# Patient Record
Sex: Male | Born: 1955 | Hispanic: No | Marital: Single | Smoking: Never smoker
Health system: Southern US, Community
[De-identification: ages and names within clinical notes are randomized; demographics above are authoritative.]

## PROBLEM LIST (undated history)

## (undated) DIAGNOSIS — J45909 Unspecified asthma, uncomplicated: Secondary | ICD-10-CM

## (undated) DIAGNOSIS — F32A Depression, unspecified: Secondary | ICD-10-CM

## (undated) DIAGNOSIS — F329 Major depressive disorder, single episode, unspecified: Secondary | ICD-10-CM

## (undated) DIAGNOSIS — G4730 Sleep apnea, unspecified: Secondary | ICD-10-CM

## (undated) DIAGNOSIS — F419 Anxiety disorder, unspecified: Secondary | ICD-10-CM

## (undated) DIAGNOSIS — I10 Essential (primary) hypertension: Secondary | ICD-10-CM

## (undated) HISTORY — DX: Major depressive disorder, single episode, unspecified: F32.9

## (undated) HISTORY — PX: REFRACTIVE SURGERY: SHX103

## (undated) HISTORY — DX: Sleep apnea, unspecified: G47.30

## (undated) HISTORY — DX: Depression, unspecified: F32.A

## (undated) HISTORY — DX: Unspecified asthma, uncomplicated: J45.909

## (undated) HISTORY — DX: Essential (primary) hypertension: I10

## (undated) HISTORY — DX: Anxiety disorder, unspecified: F41.9

---

## 1998-07-24 ENCOUNTER — Emergency Department (HOSPITAL_COMMUNITY): Admission: EM | Admit: 1998-07-24 | Discharge: 1998-07-24 | Payer: Self-pay | Admitting: Emergency Medicine

## 2012-08-08 ENCOUNTER — Ambulatory Visit (INDEPENDENT_AMBULATORY_CARE_PROVIDER_SITE_OTHER): Payer: Self-pay | Admitting: Family Medicine

## 2012-08-08 VITALS — BP 138/82 | HR 92 | Temp 97.9°F | Resp 18 | Ht 64.5 in | Wt 209.6 lb

## 2012-08-08 DIAGNOSIS — Z0289 Encounter for other administrative examinations: Secondary | ICD-10-CM

## 2012-08-08 DIAGNOSIS — H547 Unspecified visual loss: Secondary | ICD-10-CM

## 2012-08-08 DIAGNOSIS — Z024 Encounter for examination for driving license: Secondary | ICD-10-CM

## 2012-08-08 DIAGNOSIS — L303 Infective dermatitis: Secondary | ICD-10-CM

## 2012-08-08 NOTE — Progress Notes (Signed)
57 year old man who is here for his DVT examination. He was supposed to set something in the [place] by February, but now he had an extension deadline until this coming Friday. He comes in today for his DVT. He has been healthy denies medical history.  Objective: Visual acuity is poor as noted. His TMs are normal. Eyes PERRLA. Fundi have some additional pigment, probably congenital, uncertain etiology. His throat was clear. Neck supple without nodes thyromegaly. Chest is clear to auscultation. Heart regular without murmurs gallops or arrhythmias. Abdomen soft without mass or tenderness. Normal external genitalia testes descended. Straight leg raise test negative. Spine normal. The piriformis symmetrical. Skin has eczematoid excoriations on his ankles. He is alert and oriented. Motor is symmetrical.  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  Plan: Cardiovascular 10 eye doctor and get his approval for his vision before I can clear him. He needs gasses from my opinion. If he brings his form back from the ophthalmologist signed I will complete his card. I actually do not need to see him again obviously this was very anxiety provoking and stressful to him since his deadline is 3 days from now, but I told him I could not help that. He understood.Marland Kitchen

## 2012-08-08 NOTE — Patient Instructions (Signed)
See an eye specialist and bring the signed form back.  Suggest seeing a dermatologist for the skin on the legs. For

## 2012-08-10 ENCOUNTER — Ambulatory Visit: Payer: Self-pay | Admitting: Family Medicine

## 2012-08-10 ENCOUNTER — Encounter: Payer: Self-pay | Admitting: Family Medicine

## 2012-08-10 ENCOUNTER — Telehealth: Payer: Self-pay | Admitting: Radiology

## 2012-08-10 VITALS — BP 189/113 | HR 84 | Temp 98.6°F | Resp 16 | Ht 64.5 in | Wt 206.0 lb

## 2012-08-10 DIAGNOSIS — Z0000 Encounter for general adult medical examination without abnormal findings: Secondary | ICD-10-CM

## 2012-08-10 DIAGNOSIS — Z0289 Encounter for other administrative examinations: Secondary | ICD-10-CM

## 2012-08-10 LAB — GLUCOSE, POCT (MANUAL RESULT ENTRY): POC Glucose: 127 mg/dl — AB (ref 70–99)

## 2012-08-10 NOTE — Telephone Encounter (Signed)
Patient came back in, unfortunately he still does not meet requirements for visual acuity even with correction. The paperwork from the optician indicates he is 20/50 with correction in his right eye. I did provide him information from the [facility] website regarding trying to get an exception for this. To you FYI

## 2012-08-10 NOTE — Progress Notes (Signed)
Subjective: Patient is here for followup with her can't his vision. He failed to the other day. He has since gotten classes ordered. His vision corrects to 20/25 bilaterally. The optometrist suspected early diabetic changes in the eye.  Objective: Very anxious, otherwise okay. Glucose satisfactory pronounced fasting specimen  Assessment: Decreased visual acuity  Truck driver CDL exam Possible early diabetic retinopathy, no diabetic diagnosis.  Plan: Advised return in about 6 months to recheck his sugars. However this is not a not part of the DOT yet.

## 2012-08-15 NOTE — Telephone Encounter (Signed)
Note regarding vision noted.

## 2014-08-11 ENCOUNTER — Ambulatory Visit: Payer: Self-pay

## 2014-12-27 ENCOUNTER — Ambulatory Visit (INDEPENDENT_AMBULATORY_CARE_PROVIDER_SITE_OTHER): Payer: Self-pay | Admitting: Emergency Medicine

## 2014-12-27 VITALS — BP 156/110 | HR 77 | Temp 98.0°F | Resp 16 | Ht 64.0 in | Wt 193.0 lb

## 2014-12-27 DIAGNOSIS — R739 Hyperglycemia, unspecified: Secondary | ICD-10-CM

## 2014-12-27 DIAGNOSIS — I10 Essential (primary) hypertension: Secondary | ICD-10-CM

## 2014-12-27 DIAGNOSIS — Z0000 Encounter for general adult medical examination without abnormal findings: Secondary | ICD-10-CM

## 2014-12-27 LAB — GLUCOSE, POCT (MANUAL RESULT ENTRY): POC Glucose: 110 mg/dl — AB (ref 70–99)

## 2014-12-27 MED ORDER — HYDROCHLOROTHIAZIDE 12.5 MG PO CAPS: 12.5000 mg | ORAL_CAPSULE | Freq: Every day | ORAL | Status: DC

## 2014-12-27 NOTE — Progress Notes (Addendum)
Patient ID: Matthew Ponce, male   DOB: 1955/10/08, 59 y.o.   MRN: [id number]    This chart was scribed for Earl Lites, MD by [facility], medical scribe at [facility].The patient was seen in exam room 02 and the patient's care was started at 2:55 PM.  Chief Complaint:  Chief Complaint  Patient presents with  . DOT   HPI: Matthew Ponce is a 59 y.o. male who reports to [facility] today for a DOT physical. Blood pressure is elevated today, 156/110 Blood pressure recheck is 176/110 on the right, and 174/110. He does not take any medicines. No family history of hypertension. Denies excessive salt intake. States he has white coat syndrome.  BP Readings from Last 3 Encounters:  12/27/14 156/110  08/10/12 189/113  08/08/12 138/82   No past medical history on file. No past surgical history on file. Social History   Social History  . Marital Status: Single    Spouse Name: N/A  . Number of Children: N/A  . Years of Education: N/A   Social History Main Topics  . Smoking status: Never Smoker   . Smokeless tobacco: Not on file  . Alcohol Use: No  . Drug Use: No  . Sexual Activity: Not on file   Other Topics Concern  . Not on file   Social History Narrative  . No narrative on file   No family history on file. No Known Allergies Prior to Admission medications   Not on File   ROS: The patient denies fevers, chills, night sweats, unintentional weight loss, chest pain, palpitations, wheezing, dyspnea on exertion, nausea, vomiting, abdominal pain, dysuria, hematuria, melena, numbness, weakness, or tingling.  All other systems have been reviewed and were otherwise negative with the exception of those mentioned in the HPI and as above.    PHYSICAL EXAM: Filed Vitals:   12/27/14 1506  BP: 156/110  Pulse: 77  Temp: 98 F (36.7 C)  Resp: 16   Body mass index is 33.11 kg/(m^2).  General: Alert, no acute distress HEENT:  Normocephalic, atraumatic, oropharynx  patent. Eye: Nonie [facility]oyer [facility] Cardiovascular:  Regular rate and rhythm, no rubs murmurs or gallops.  No Carotid bruits, radial pulse intact. No pedal edema.  Respiratory: Clear to auscultation bilaterally.  No wheezes, rales, or rhonchi.  No cyanosis, no use of accessory musculature Abdominal: No organomegaly, abdomen is soft and non-tender, positive bowel sounds.  No masses. Musculoskeletal: Gait intact. No edema, tenderness Skin: No rashes. Neurologic: Facial musculature symmetric. Psychiatric: Patient acts appropriately throughout our interaction. Lymphatic: No cervical or submandibular lymphadenopathy  LABS: Results for orders placed or performed in visit on 08/10/12  POCT glucose (manual entry)  Result Value Ref Range   POC Glucose 127 (A) 70 - 99 mg/dl   Results for orders placed or performed in visit on 12/27/14  POCT glucose (manual entry)  Result Value Ref Range   POC Glucose 110 (A) 70 - 99 mg/dl    EKG/XRAY:   Primary read interpreted by Dr. Cleta Alberts at [facility].  ASSESSMENT/PLAN:  patient does not qualify for a DOT card. I told him he would need to be seen by a regular physician. And evaluated regarding his blood pressure. I advised him the blood pressure must be less than 140/90 to qualify for DOT card. He was agreeable to start on HCTZ 12.5. I gave him information regarding hypertension I advised him to establish himself with a PCP to follow his blood pressure. In the interim he  will have it checked in either [facility] or [facility] and record his blood pressure readings. Gross sideeffects, risk and benefits, and alternatives of medications d/w patient. Patient is aware that all medications have potential sideeffects and we are unable to predict every sideeffect or drug-drug interaction that may occur.    Lesle Chris MD 12/27/2014 3:09 PM

## 2014-12-27 NOTE — Patient Instructions (Signed)

## 2015-04-14 ENCOUNTER — Encounter (HOSPITAL_COMMUNITY): Payer: Self-pay | Admitting: Emergency Medicine

## 2015-04-14 ENCOUNTER — Emergency Department (HOSPITAL_COMMUNITY)
Admission: EM | Admit: 2015-04-14 | Discharge: 2015-04-14 | Disposition: A | Discharge status: Home / Self Care | Payer: Self-pay | Attending: Emergency Medicine | Admitting: Emergency Medicine

## 2015-04-14 DIAGNOSIS — Z79899 Other long term (current) drug therapy: Secondary | ICD-10-CM | POA: Insufficient documentation

## 2015-04-14 DIAGNOSIS — R531 Weakness: Secondary | ICD-10-CM | POA: Insufficient documentation

## 2015-04-14 LAB — CBC WITH DIFFERENTIAL/PLATELET
BASOS PCT: 0 %
Basophils Absolute: 0 10*3/uL (ref 0.0–0.1)
EOS ABS: 0 10*3/uL (ref 0.0–0.7)
EOS PCT: 1 %
HCT: 32.9 % — ABNORMAL LOW (ref 39.0–52.0)
HEMOGLOBIN: 10.6 g/dL — AB (ref 13.0–17.0)
LYMPHS ABS: 1.3 10*3/uL (ref 0.7–4.0)
Lymphocytes Relative: 33 %
MCH: 30 pg (ref 26.0–34.0)
MCHC: 32.2 g/dL (ref 30.0–36.0)
MCV: 93.2 fL (ref 78.0–100.0)
MONOS PCT: 11 %
Monocytes Absolute: 0.4 10*3/uL (ref 0.1–1.0)
NEUTROS PCT: 55 %
Neutro Abs: 2.2 10*3/uL (ref 1.7–7.7)
PLATELETS: 204 10*3/uL (ref 150–400)
RBC: 3.53 MIL/uL — ABNORMAL LOW (ref 4.22–5.81)
RDW: 15.8 % — ABNORMAL HIGH (ref 11.5–15.5)
WBC: 3.9 10*3/uL — AB (ref 4.0–10.5)

## 2015-04-14 LAB — URINALYSIS, ROUTINE W REFLEX MICROSCOPIC
BILIRUBIN URINE: NEGATIVE
GLUCOSE, UA: NEGATIVE mg/dL
HGB URINE DIPSTICK: NEGATIVE
KETONES UR: NEGATIVE mg/dL
Leukocytes, UA: NEGATIVE
Nitrite: NEGATIVE
PH: 6 (ref 5.0–8.0)
Protein, ur: NEGATIVE mg/dL
SPECIFIC GRAVITY, URINE: 1.028 (ref 1.005–1.030)

## 2015-04-14 LAB — COMPREHENSIVE METABOLIC PANEL
ALBUMIN: 3.8 g/dL (ref 3.5–5.0)
ALT: 16 U/L — ABNORMAL LOW (ref 17–63)
ANION GAP: 4 — AB (ref 5–15)
AST: 20 U/L (ref 15–41)
Alkaline Phosphatase: 35 U/L — ABNORMAL LOW (ref 38–126)
BUN: 29 mg/dL — AB (ref 6–20)
CALCIUM: 9.1 mg/dL (ref 8.9–10.3)
CHLORIDE: 110 mmol/L (ref 101–111)
CO2: 30 mmol/L (ref 22–32)
CREATININE: 0.56 mg/dL — AB (ref 0.61–1.24)
GFR calc non Af Amer: >60 mL/min (ref >60)
GLUCOSE: 96 mg/dL (ref 65–99)
POTASSIUM: 4 mmol/L (ref 3.5–5.1)
SODIUM: 144 mmol/L (ref 135–145)
Total Bilirubin: 0.8 mg/dL (ref 0.3–1.2)
Total Protein: 6.6 g/dL (ref 6.5–8.1)

## 2015-04-14 LAB — CBG MONITORING, ED: Glucose-Capillary: 83 mg/dL (ref 65–99)

## 2015-04-14 LAB — TROPONIN I

## 2015-04-14 NOTE — ED Provider Notes (Signed)
CSN: [id number]     Arrival date & time 04/14/15  1354 History   First MD Initiated Contact with Patient 04/14/15 1943     Chief Complaint  Patient presents with  . Blurred Vision  . wants to be checked for diabetes      (Consider location/radiation/quality/duration/timing/severity/associated sxs/prior Treatment) HPI Comments: Patient here with several complaints including concern for diabetes as well as high blood pressure. He has no primary care doctor. 2 months ago was told his blood pressure was elevated and has not followed up with that. Taste that he has had some blurred vision but denies any polyuria polydipsia. Has endorsed some weight loss over the past 5-6 months. Denies any change in his bowel habits. No chest or abdominal pain. Says he just does not feel well that this is been several month duration of the symptoms. Has not seen a provider recently for these complaints due to insurance issues  The history is provided by the patient.    History reviewed. No pertinent past medical history. History reviewed. No pertinent past surgical history. No family history on file. Social History  Substance Use Topics  . Smoking status: Never Smoker   . Smokeless tobacco: None  . Alcohol Use: No    Review of Systems  All other systems reviewed and are negative.     Allergies  Review of patient's allergies indicates no known allergies.  Home Medications   Prior to Admission medications   Medication Sig Start Date End Date Taking? Authorizing Provider  hydrochlorothiazide (MICROZIDE) 12.5 MG capsule Take 1 capsule (12.5 mg total) by mouth daily. 12/27/14   Collene GobbleSteven A Daub, MD   BP 142/95 mmHg  Pulse 62  Temp(Src) 98.1 F (36.7 C) (Oral)  Resp 16  Ht 5\' 7"  (1.702 m)  Wt 60.555 kg  BMI 20.90 kg/m2  SpO2 100% Physical Exam  Constitutional: He is oriented to person, place, and time. He appears well-developed and well-nourished.  Non-toxic appearance. No distress.  HENT:   Head: Normocephalic and atraumatic.  Eyes: Conjunctivae, EOM and lids are normal. Pupils are equal, round, and reactive to light.  Neck: Normal range of motion. Neck supple. No tracheal deviation present. No thyroid mass present.  Cardiovascular: Normal rate, regular rhythm and normal heart sounds.  Exam reveals no gallop.   No murmur heard. Pulmonary/Chest: Effort normal and breath sounds normal. No stridor. No respiratory distress. He has no decreased breath sounds. He has no wheezes. He has no rhonchi. He has no rales.  Abdominal: Soft. Normal appearance and bowel sounds are normal. He exhibits no distension. There is no tenderness. There is no rebound and no CVA tenderness.  Musculoskeletal: Normal range of motion. He exhibits no edema or tenderness.  Neurological: He is alert and oriented to person, place, and time. He has normal strength. No cranial nerve deficit or sensory deficit. GCS eye subscore is 4. GCS verbal subscore is 5. GCS motor subscore is 6.  Skin: Skin is warm and dry. No abrasion and no rash noted.  Psychiatric: He has a normal mood and affect. His speech is normal and behavior is normal.  Nursing note and vitals reviewed.   ED Course  Procedures (including critical care time) Labs Review Labs Reviewed  CBC WITH DIFFERENTIAL/PLATELET - Abnormal; Notable for the following:    WBC 3.9 (*)    RBC 3.53 (*)    Hemoglobin 10.6 (*)    HCT 32.9 (*)    RDW 15.8 (*)    All  other components within normal limits  COMPREHENSIVE METABOLIC PANEL - Abnormal; Notable for the following:    BUN 29 (*)    Creatinine, Ser 0.56 (*)    ALT 16 (*)    Alkaline Phosphatase 35 (*)    Anion gap 4 (*)    All other components within normal limits  URINALYSIS, ROUTINE W REFLEX MICROSCOPIC (NOT AT [facility]) - Abnormal; Notable for the following:    APPearance CLOUDY (*)    All other components within normal limits  TROPONIN I  CBG MONITORING, ED    Imaging Review No results found. I have  personally reviewed and evaluated these images and lab results as part of my medical decision-making.   EKG Interpretation   Date/Time:  Monday April 14 2015 20:32:33 EST Ventricular Rate:  58 PR Interval:  169 QRS Duration: 83 QT Interval:  440 QTC Calculation: 432 R Axis:   49 Text Interpretation:  Sinus rhythm Atrial premature complex Borderline T  abnormalities, inferior leads Borderline ST elevation, inferior leads  Confirmed by Freida Busman  MD, Geraldina Parrott ([id number]) on 04/14/2015 8:45:19 PM      MDM   Final diagnoses:  None    Patient without acute findings on his workup here today. Will give referral to community wellness    Lorre Nick, MD 04/14/15 2215

## 2015-04-14 NOTE — ED Notes (Signed)
Pt states that around 2 months ago he was at [facility] and was told he has high blood pressure but wants started on any medications.  [facility]  Pt states that he has had blurred vision, slurred speech about 2 years ago and wants to be checked for diabetes.  Pt states that he doesn't know how to eat if he has diabetes.  Pt states that he has lost about 40#s over 5-6 months.

## 2015-04-14 NOTE — Discharge Instructions (Signed)
Weakness °Weakness is a lack of strength. It may be felt all over the body (generalized) or in one specific part of the body (focal). Some causes of weakness can be serious. You may need further medical evaluation, especially if you are elderly or you have a history of immunosuppression (such as chemotherapy or HIV), kidney disease, heart disease, or diabetes. °CAUSES  °Weakness can be caused by many different things, including: °· Infection. °· Physical exhaustion. °· Internal bleeding or other blood loss that results in a lack of red blood cells (anemia). °· Dehydration. This cause is more common in elderly people. °· Side effects or electrolyte abnormalities from medicines, such as pain medicines or sedatives. °· Emotional distress, anxiety, or depression. °· Circulation problems, especially severe peripheral arterial disease. °· Heart disease, such as rapid atrial fibrillation, bradycardia, or heart failure. °· Nervous system disorders, such as Guillain-Barré syndrome, multiple sclerosis, or stroke. °DIAGNOSIS  °To find the cause of your weakness, your caregiver will take your history and perform a physical exam. Lab tests or X-rays may also be ordered, if needed. °TREATMENT  °Treatment of weakness depends on the cause of your symptoms and can vary greatly. °HOME CARE INSTRUCTIONS  °· Rest as needed. °· Eat a well-balanced diet. °· Try to get some exercise every day. °· Only take over-the-counter or prescription medicines as directed by your caregiver. °SEEK MEDICAL CARE IF:  °· Your weakness seems to be getting worse or spreads to other parts of your body. °· You develop new aches or pains. °SEEK IMMEDIATE MEDICAL CARE IF:  °· You cannot perform your normal daily activities, such as getting dressed and feeding yourself. °· You cannot walk up and down stairs, or you feel exhausted when you do so. °· You have shortness of breath or chest pain. °· You have difficulty moving parts of your body. °· You have weakness  in only one area of the body or on only one side of the body. °· You have a fever. °· You have trouble speaking or swallowing. °· You cannot control your bladder or bowel movements. °· You have black or bloody vomit or stools. °MAKE SURE YOU: °· Understand these instructions. °· Will watch your condition. °· Will get help right away if you are not doing well or get worse. °  °This information is not intended to replace advice given to you by your health care provider. Make sure you discuss any questions you have with your health care provider. °  °Document Released: 04/19/2005 Document Revised: 10/19/2011 Document Reviewed: 06/18/2011 °Elsevier Interactive Patient Education ©2016 [employer] Inc[employer] ° °Fatigue °Fatigue is feeling tired all of the time, a lack of energy, or a lack of motivation. Occasional or mild fatigue is often a normal response to activity or life in general. However, long-lasting (chronic) or extreme fatigue may indicate an underlying medical condition. °HOME CARE INSTRUCTIONS  °Watch your fatigue for any changes. The following actions may help to lessen any discomfort you are feeling: °· Talk to your health care provider about how much sleep you need each night. Try to get the required amount every night. °· Take medicines only as directed by your health care provider. °· Eat a healthy and nutritious diet. Ask your health care provider if you need help changing your diet. °· Drink enough fluid to keep your urine clear or pale yellow. °· Practice ways of relaxing, such as yoga, meditation, massage therapy, or acupuncture. °· Exercise regularly.   °· Change situations that cause you   stress. Try to keep your work and personal routine reasonable. °· Do not abuse illegal drugs. °· Limit alcohol intake to no more than 1 drink per day for nonpregnant women and 2 drinks per day for men. One drink equals 12 ounces of beer, 5 ounces of wine, or 1½ ounces of hard liquor. °· Take a multivitamin, if directed by  your health care provider. °SEEK MEDICAL CARE IF:  °· Your fatigue does not get better. °· You have a fever.   °· You have unintentional weight loss or gain. °· You have headaches.   °· You have difficulty:   °¨ Falling asleep. °¨ Sleeping throughout the night. °· You feel angry, guilty, anxious, or sad.    °· You are unable to have a bowel movement (constipation).   °· You skin is dry.    °· Your legs or another part of your body is swollen.   °SEEK IMMEDIATE MEDICAL CARE IF:  °· You feel confused.   °· Your vision is blurry. °· You feel faint or pass out.   °· You have a severe headache.   °· You have severe abdominal, pelvic, or back pain.   °· You have chest pain, shortness of breath, or an irregular or fast heartbeat.   °· You are unable to urinate or you urinate less than normal.   °· You develop abnormal bleeding, such as bleeding from the rectum, vagina, nose, lungs, or nipples. °· You vomit blood.    °· You have thoughts about harming yourself or committing suicide.   °· You are worried that you might harm someone else.   °  °This information is not intended to replace advice given to you by your health care provider. Make sure you discuss any questions you have with your health care provider. °  °Document Released: 02/14/2007 Document Revised: 05/10/2014 Document Reviewed: 08/21/2013 °Elsevier Interactive Patient Education ©2016 Elsevier Inc. ° °

## 2015-10-22 ENCOUNTER — Other Ambulatory Visit: Payer: Self-pay | Admitting: Internal Medicine

## 2015-10-22 ENCOUNTER — Ambulatory Visit
Admission: RE | Admit: 2015-10-22 | Discharge: 2015-10-22 | Disposition: A | Discharge status: Home / Self Care | Payer: Self-pay | Source: Ambulatory Visit | Attending: Internal Medicine | Admitting: Internal Medicine

## 2015-10-22 DIAGNOSIS — Z0000 Encounter for general adult medical examination without abnormal findings: Secondary | ICD-10-CM

## 2016-05-12 ENCOUNTER — Ambulatory Visit: Payer: Self-pay | Attending: Family Medicine | Admitting: Family Medicine

## 2016-05-12 ENCOUNTER — Encounter: Payer: Self-pay | Admitting: Family Medicine

## 2016-05-12 VITALS — BP 124/79 | HR 60 | Temp 98.5°F | Resp 18 | Ht 64.0 in | Wt 137.6 lb

## 2016-05-12 DIAGNOSIS — Z79899 Other long term (current) drug therapy: Secondary | ICD-10-CM | POA: Insufficient documentation

## 2016-05-12 DIAGNOSIS — R1084 Generalized abdominal pain: Secondary | ICD-10-CM | POA: Insufficient documentation

## 2016-05-12 DIAGNOSIS — R634 Abnormal weight loss: Secondary | ICD-10-CM | POA: Insufficient documentation

## 2016-05-12 DIAGNOSIS — I10 Essential (primary) hypertension: Secondary | ICD-10-CM | POA: Insufficient documentation

## 2016-05-12 DIAGNOSIS — R194 Change in bowel habit: Secondary | ICD-10-CM | POA: Insufficient documentation

## 2016-05-12 LAB — CBC WITH DIFFERENTIAL/PLATELET
BASOS ABS: 36 {cells}/uL (ref 0–200)
Basophils Relative: 1 %
Eosinophils Absolute: 72 cells/uL (ref 15–500)
Eosinophils Relative: 2 %
HCT: 41.3 % (ref 38.5–50.0)
HEMOGLOBIN: 13.4 g/dL (ref 13.2–17.1)
LYMPHS ABS: 1548 {cells}/uL (ref 850–3900)
Lymphocytes Relative: 43 %
MCH: 29.4 pg (ref 27.0–33.0)
MCHC: 32.4 g/dL (ref 32.0–36.0)
MCV: 90.6 fL (ref 80.0–100.0)
MONO ABS: 252 {cells}/uL (ref 200–950)
MPV: 10.8 fL (ref 7.5–12.5)
Monocytes Relative: 7 %
NEUTROS ABS: 1692 {cells}/uL (ref 1500–7800)
NEUTROS PCT: 47 %
Platelets: 232 10*3/uL (ref 140–400)
RBC: 4.56 MIL/uL (ref 4.20–5.80)
RDW: 13.5 % (ref 11.0–15.0)
WBC: 3.6 10*3/uL — ABNORMAL LOW (ref 3.8–10.8)

## 2016-05-12 LAB — TSH: TSH: 3.87 mIU/L (ref 0.40–4.50)

## 2016-05-12 LAB — POCT GLYCOSYLATED HEMOGLOBIN (HGB A1C): Hemoglobin A1C: 5.1

## 2016-05-12 MED ORDER — HYDROCHLOROTHIAZIDE 12.5 MG PO TABS: 12.5000 mg | ORAL_TABLET | Freq: Every day | ORAL | 2 refills | Status: DC

## 2016-05-12 NOTE — Progress Notes (Signed)
   Subjective:  Patient ID: Matthew Ponce, male    DOB: [birth year]  Age: 61 y.o. MRN: [id number]  CC: Establish Care  HPI Matthew Ponce presents for abdominal complaint. He is accompanied by his friend.  He states " It feels like my stomach is churning". Reports difficulty with bowel movements with pencil like stools. Denies any rectal bleeding or bloody stools. Reports poor appetite and weight loss of 190 to 135 within the last 2 years. Denies any difficulty swallowing. His friend reports patient has been living in a [place] for 1 year after. [place] family [place] in [place]. Friend reports encouraging the patient to eat and drink but his appetite remains poor.  Outpatient Medications Prior to Visit  Medication Sig Dispense Refill  . hydrochlorothiazide (MICROZIDE) 12.5 MG capsule Take 1 capsule (12.5 mg total) by mouth daily. (Patient not taking: Reported on 04/14/2015) 30 capsule 1   No facility-administered medications prior to visit.     ROS Review of Systems  Constitutional: Negative.   HENT: Negative.   Respiratory: Negative.   Cardiovascular: Negative.   Gastrointestinal: Positive for abdominal pain.       Change in bowel habits.   Objective:  BP 124/79 (BP Location: Left Arm, Patient Position: Sitting, Cuff Size: Normal)   Pulse 60   Temp 98.5 F (36.9 C) (Oral)   Resp 18   Ht 5\' 4"  (1.626 m)   Wt 137 lb 9.6 oz (62.4 kg)   SpO2 99%   BMI 23.62 kg/m   BP/Weight 05/12/2016 04/14/2015 12/27/2014  Systolic BP 124 137 156  Diastolic BP 79 94 110  Wt. (Lbs) 137.6 133.5 193  BMI 23.62 20.9 33.11   Physical Exam  HENT:  Mouth/Throat: Oropharynx is clear and moist.  Eyes: Conjunctivae are normal. Pupils are equal, round, and reactive to light.  Neck: Normal range of motion.  Cardiovascular: Normal rate, regular rhythm and normal heart sounds.   Pulmonary/Chest: Effort normal and breath sounds normal.  Abdominal: Soft. Bowel sounds are normal. There is tenderness (generalized).    Lymphadenopathy:    He has no cervical adenopathy.  Psychiatric: His behavior is normal. His mood appears anxious. His speech is rapid and/or pressured.  Vitals reviewed.  Assessment & Plan:   Problem List Items Addressed This Visit      Cardiovascular and Mediastinum   Essential hypertension   Relevant Medications   hydrochlorothiazide (HYDRODIURIL) 12.5 MG tablet    Other Visit Diagnoses    Generalized abdominal cramping    -  Primary   Relevant Orders   Basic Metabolic Panel (Completed)   CBC with Differential (Completed)   Hepatic Function Panel (Completed)   Ambulatory referral to Gastroenterology   Loss of weight       Relevant Orders   TSH (Completed)   HgB A1c (Completed)   Ambulatory referral to Gastroenterology   Bowel habit changes       Relevant Orders   Ambulatory referral to Gastroenterology     Meds ordered this encounter  Medications  . hydrochlorothiazide (HYDRODIURIL) 12.5 MG tablet    Sig: Take 1 tablet (12.5 mg total) by mouth daily.    Dispense:  30 tablet    Refill:  2    Order Specific Question:   Supervising Provider    Answer:   Quentin AngstJEGEDE, OLUGBEMIGA E [id number]    Follow-up: Return if symptoms worsen or fail to improve.   Lizbeth BarkMandesia R Kanyon Bunn FNP

## 2016-05-12 NOTE — Progress Notes (Signed)
Patient is here for establish care   Patient been complaining for two weeks about his abdominal he feels like he want to pass gas but he dont, other times he just feel pain a pain level of 7  Patient have not eaten today or taken any medications  BP 124/79 p 60

## 2016-05-12 NOTE — Patient Instructions (Addendum)
Malnutrition Introduction Malnutrition is any condition in which nutrition is poor. There are many forms of malnutrition. A common form is having too little of one kind of nutrient (nutritional deficiency). Nutrients include proteins, minerals, carbohydrates, fats, and vitamins. They provide the body with energy and keep the body working normally. Malnutrition ranges from mild to severe. The condition affects the body's defense system (immune system). Because of this, people who are malnourished are more likely to develop health problems and get sick. What are the causes? Causes of malnutrition include:  Eating an unbalanced diet.  Eating too much of certain foods.  Eating too little.  Conditions that decrease the body's ability to use nutrients. What increases the risk? Risk factors include:  Pregnancy and lactation. Women who are pregnant may become malnourished if they do not increase their nutrient intake. They are also susceptible to folic acid deficiency.  Increasing age. The body's ability to absorb nutrients decreases with age. This can contribute to iron, calcium, and vitamin D deficiencies.  Alcohol or drug dependency. Addiction often leads to a lifestyle in which proper nourishment is ignored. Dependency can also hurt the metabolism and the body's ability to absorb nutrients. Alcoholism is a major cause of thiamine deficiency and can lead to deficiencies of magnesium, zinc, and other vitamins.  Eating disorders, such as anorexia nervosa. People with these disorders may eat too little or too much.  Chewing or swallowing problems. People with these disorders may not eat enough.  Certain diseases, including:  Long-lasting (chronic) diseases. Chronic diseases tend to affect the absorption of calcium, iron, and vitamins B12, A, D, E, and K.  Liver disease. Liver disease affects the storage of vitamins A and B12. It also interferes with the metabolism of protein and energy  sources.  Kidney disease. Kidney disease may cause deficiencies of protein, iron, and vitamin D.  Cancer or AIDS. These diseases can cause a loss of appetite.  Cystic fibrosis. This disease can make it difficult for the body to absorb nutrients.  Certain diets, including.  The vegetarian diet. Vegetarians are at risk for iron deficiency.  The vegan diet. Vegans are susceptible to vitamin B12, calcium, iron, vitamin D, and zinc deficiencies.  The fruitarian diet. This diet can be deficient in protein, sodium, and many micronutrients.  Many commercial "fad" diets, including those that claim to enhance well-being and reduce weight.  Very low calorie diets.  Low income. People with a low income may have trouble paying for nutritious foods. What are the signs or symptoms? Signs and symptoms depend on the kind of malnutrition you have. Common symptoms include:  Fatigue.  Weakness.  Dizziness.  Fainting  Weight loss.  Poor immune response.  Lack of menstruation.  Hair loss.  Poor memory. How is this diagnosed? Malnutrition may be diagnosed by:  A medical history.  A dietary history.  A physical exam. This may include a measurement of your body mass index (BMI).  Blood tests. How is this treated? Treatments vary depending on the cause of the malnutrition. Common treatments include:  Dietary changes.  Dietary supplements, such as vitamins and minerals.  Treatment of any underlying conditions. Follow these instructions at home:  Eat a balanced diet.  Take dietary supplements as directed by your health care provider.  Exercise regularly. Exercising can improve appetite.  Keep all follow-up visits as directed by your health care provider. This is important. How is this prevented? Eating a well-balanced diet helps to prevent most forms of malnutrition. Contact a  health care provider if:  You have increased weakness or fatigue.  You faint.  You stop  menstruating.  You have rapid hair loss.  You have unexpected weight loss. This information is not intended to replace advice given to you by your health care provider. Make sure you discuss any questions you have with your health care provider. Document Released: 03/05/2005 Document Revised: 09/25/2015 Document Reviewed: 12/14/2013  2017 Elsevier  Colonoscopy, Adult A colonoscopy is an exam to look at the entire large intestine. During the exam, a lubricated, bendable tube is inserted into the anus and then passed into the rectum, colon, and other parts of the large intestine. A colonoscopy is often done as a part of normal colorectal screening or in response to certain symptoms, such as anemia, persistent diarrhea, abdominal pain, and blood in the stool. The exam can help screen for and diagnose medical problems, including:  Tumors.  Polyps.  Inflammation.  Areas of bleeding. Tell a health care provider about:  Any allergies you have.  All medicines you are taking, including vitamins, herbs, eye drops, creams, and over-the-counter medicines.  Any problems you or family members have had with anesthetic medicines.  Any blood disorders you have.  Any surgeries you have had.  Any medical conditions you have.  Any problems you have had passing stool. What are the risks? Generally, this is a safe procedure. However, problems may occur, including:  Bleeding.  A tear in the intestine.  A reaction to medicines given during the exam.  Infection (rare). What happens before the procedure? Eating and drinking restrictions  Follow instructions from your health care provider about eating and drinking, which may include:  A few days before the procedure - follow a low-fiber diet. Avoid nuts, seeds, dried fruit, raw fruits, and vegetables.  1-3 days before the procedure - follow a clear liquid diet. Drink only clear liquids, such as clear broth or bouillon, black coffee or tea,  clear juice, clear soft drinks or sports drinks, gelatin desert, and popsicles. Avoid any liquids that contain red or purple dye.  On the day of the procedure - do not eat or drink anything during the 2 hours before the procedure, or within the time period that your health care provider recommends. Bowel prep  If you were prescribed an oral bowel prep to clean out your colon:  Take it as told by your health care provider. Starting the day before your procedure, you will need to drink a large amount of medicated liquid. The liquid will cause you to have multiple loose stools until your stool is almost clear or light green.  If your skin or anus gets irritated from diarrhea, you may use these to relieve the irritation:  Medicated wipes, such as adult wet wipes with aloe and vitamin E.  A skin soothing-product like petroleum jelly.  If you vomit while drinking the bowel prep, take a break for up to 60 minutes and then begin the bowel prep again. If vomiting continues and you cannot take the bowel prep without vomiting, call your health care provider. General instructions  Ask your health care provider about changing or stopping your regular medicines. This is especially important if you are taking diabetes medicines or blood thinners.  Plan to have someone take you home from the hospital or clinic. What happens during the procedure?  An IV tube may be inserted into one of your veins.  You will be given medicine to help you relax (sedative).  To  reduce your risk of infection:  Your health care team will wash or sanitize their hands.  Your anal area will be washed with soap.  You will be asked to lie on your side with your knees bent.  Your health care provider will lubricate a long, thin, flexible tube. The tube will have a camera and a light on the end.  The tube will be inserted into your anus.  The tube will be gently eased through your rectum and colon.  Air will be delivered  into your colon to keep it open. You may feel some pressure or cramping.  The camera will be used to take images during the procedure.  A small tissue sample may be removed from your body to be examined under a microscope (biopsy). If any potential problems are found, the tissue will be sent to a lab for testing.  If small polyps are found, your health care provider may remove them and have them checked for cancer cells.  The tube that was inserted into your anus will be slowly removed. The procedure may vary among health care providers and hospitals. What happens after the procedure?  Your blood pressure, heart rate, breathing rate, and blood oxygen level will be monitored until the medicines you were given have worn off.  Do not drive for 24 hours after the exam.  You may have a small amount of blood in your stool.  You may pass gas and have mild abdominal cramping or bloating due to the air that was used to inflate your colon during the exam.  It is up to you to get the results of your procedure. Ask your health care provider, or the department performing the procedure, when your results will be ready. This information is not intended to replace advice given to you by your health care provider. Make sure you discuss any questions you have with your health care provider. Document Released: 04/16/2000 Document Revised: 11/07/2015 Document Reviewed: 07/01/2015 Elsevier Interactive Patient Education  2017 [employer].

## 2016-05-13 LAB — BASIC METABOLIC PANEL
BUN: 22 mg/dL (ref 7–25)
CO2: 29 mmol/L (ref 20–31)
Calcium: 9.6 mg/dL (ref 8.6–10.3)
Chloride: 106 mmol/L (ref 98–110)
Creat: 0.92 mg/dL (ref 0.70–1.33)
GLUCOSE: 84 mg/dL (ref 65–99)
Potassium: 4.1 mmol/L (ref 3.5–5.3)
SODIUM: 142 mmol/L (ref 135–146)

## 2016-05-13 LAB — HEPATIC FUNCTION PANEL
ALBUMIN: 4.3 g/dL (ref 3.6–5.1)
ALT: 9 U/L (ref 9–46)
AST: 16 U/L (ref 10–35)
Alkaline Phosphatase: 56 U/L (ref 40–115)
BILIRUBIN TOTAL: 0.9 mg/dL (ref 0.2–1.2)
Bilirubin, Direct: 0.2 mg/dL (ref <=0.2)
Indirect Bilirubin: 0.7 mg/dL (ref 0.2–1.2)
Total Protein: 7.7 g/dL (ref 6.1–8.1)

## 2016-05-21 ENCOUNTER — Telehealth: Payer: Self-pay

## 2016-05-21 NOTE — Telephone Encounter (Signed)
Interpreter name Colman Caterrij  Interpreter [phone]  Cma call to go over lab results  A patient friend answer and stated that's the only contact number he has and that he was at school and I told that I was going to call back but he stated that thats the only way we can contact so I left the message with the friend

## 2016-05-21 NOTE — Telephone Encounter (Signed)
-----   Message from Lizbeth BarkMandesia R Hairston, [place] sent at 05/21/2016  8:07 AM EST ----- -Labs were normal. -Thyroid function is normal. Abnormal thyroid function can cause weight loss. -HgbA1c is normal. HgbA1c is used to screen for DM. DM can cause weight loss. -Liver function is normal. -Follow up with gastroenterology for colonoscopy.

## 2016-06-10 ENCOUNTER — Ambulatory Visit: Payer: Self-pay | Attending: Family Medicine

## 2016-10-27 ENCOUNTER — Ambulatory Visit: Payer: Self-pay | Attending: Family Medicine

## 2017-02-08 ENCOUNTER — Ambulatory Visit (HOSPITAL_COMMUNITY)
Admission: RE | Admit: 2017-02-08 | Discharge: 2017-02-08 | Disposition: A | Discharge status: Home / Self Care | Payer: Self-pay | Source: Ambulatory Visit | Attending: Family Medicine | Admitting: Family Medicine

## 2017-02-08 ENCOUNTER — Encounter: Payer: Self-pay | Admitting: Family Medicine

## 2017-02-08 ENCOUNTER — Ambulatory Visit: Payer: Self-pay | Attending: Family Medicine | Admitting: Family Medicine

## 2017-02-08 VITALS — BP 125/72 | HR 59 | Temp 97.7°F | Resp 18 | Ht 67.0 in | Wt 129.2 lb

## 2017-02-08 DIAGNOSIS — M546 Pain in thoracic spine: Secondary | ICD-10-CM | POA: Insufficient documentation

## 2017-02-08 DIAGNOSIS — Z79899 Other long term (current) drug therapy: Secondary | ICD-10-CM | POA: Insufficient documentation

## 2017-02-08 DIAGNOSIS — R109 Unspecified abdominal pain: Secondary | ICD-10-CM | POA: Insufficient documentation

## 2017-02-08 DIAGNOSIS — R5383 Other fatigue: Secondary | ICD-10-CM

## 2017-02-08 DIAGNOSIS — R195 Other fecal abnormalities: Secondary | ICD-10-CM

## 2017-02-08 DIAGNOSIS — I10 Essential (primary) hypertension: Secondary | ICD-10-CM | POA: Insufficient documentation

## 2017-02-08 DIAGNOSIS — G8929 Other chronic pain: Secondary | ICD-10-CM | POA: Insufficient documentation

## 2017-02-08 DIAGNOSIS — F419 Anxiety disorder, unspecified: Secondary | ICD-10-CM

## 2017-02-08 DIAGNOSIS — R300 Dysuria: Secondary | ICD-10-CM | POA: Insufficient documentation

## 2017-02-08 DIAGNOSIS — Z131 Encounter for screening for diabetes mellitus: Secondary | ICD-10-CM

## 2017-02-08 LAB — POCT URINALYSIS DIPSTICK
Blood, UA: NEGATIVE
Glucose, UA: NEGATIVE
KETONES UA: NEGATIVE
LEUKOCYTES UA: NEGATIVE
Nitrite, UA: NEGATIVE
Protein, UA: 30
Spec Grav, UA: 1.025 (ref 1.010–1.025)
Urobilinogen, UA: 1 E.U./dL
pH, UA: 6 (ref 5.0–8.0)

## 2017-02-08 LAB — POCT GLYCOSYLATED HEMOGLOBIN (HGB A1C): Hemoglobin A1C: 5.2

## 2017-02-08 LAB — HEMOCCULT GUIAC POC 1CARD (OFFICE): FECAL OCCULT BLD: NEGATIVE

## 2017-02-08 MED ORDER — ACETAMINOPHEN 500 MG PO TABS: 500.0000 mg | ORAL_TABLET | Freq: Four times a day (QID) | ORAL | 0 refills | Status: DC | PRN

## 2017-02-08 NOTE — Progress Notes (Signed)
Patient is here for lower abdomen pain colon area comes & goes

## 2017-02-08 NOTE — Patient Instructions (Addendum)
Abdominal Pain, Adult Apply for orange card   Many things can cause belly (abdominal) pain. Most times, belly pain is not dangerous. Many cases of belly pain can be watched and treated at home. Sometimes belly pain is serious, though. Your doctor will try to find the cause of your belly pain. Follow these instructions at home:  Take over-the-counter and prescription medicines only as told by your doctor. Do not take medicines that help you poop (laxatives) unless told to by your doctor.  Drink enough fluid to keep your pee (urine) clear or pale yellow.  Watch your belly pain for any changes.  Keep all follow-up visits as told by your doctor. This is important. Contact a doctor if:  Your belly pain changes or gets worse.  You are not hungry, or you lose weight without trying.  You are having trouble pooping (constipated) or have watery poop (diarrhea) for more than 2-3 days.  You have pain when you pee or poop.  Your belly pain wakes you up at night.  Your pain gets worse with meals, after eating, or with certain foods.  You are throwing up and cannot keep anything down.  You have a fever. Get help right away if:  Your pain does not go away as soon as your doctor says it should.  You cannot stop throwing up.  Your pain is only in areas of your belly, such as the right side or the left lower part of the belly.  You have bloody or black poop, or poop that looks like tar.  You have very bad pain, cramping, or bloating in your belly.  You have signs of not having enough fluid or water in your body (dehydration), such as: ? Dark pee, very little pee, or no pee. ? Cracked lips. ? Dry mouth. ? Sunken eyes. ? Sleepiness. ? Weakness. This information is not intended to replace advice given to you by your health care provider. Make sure you discuss any questions you have with your health care provider. Document Released: 10/06/2007 Document Revised: 11/07/2015 Document  Reviewed: 10/01/2015 Elsevier Interactive Patient Education  2017 [employer].   Eating Healthy on a Budget There are many ways to save money at the grocery store and continue to eat healthy. You can be successful if you plan your meals according to your budget, purchase according to your budget and grocery list, and prepare food yourself. How can I buy more food on a limited budget? Plan  Plan meals and snacks according to a grocery list and budget you create.  Look for recipes where you can cook once and make enough food for two meals.  Include meals that will "stretch" more expensive foods such as stews, casseroles, and stir-fry dishes.  Make a grocery list and make sure to bring it with you to the store. If you have a smart phone, you could use your phone to create your shopping list. Purchase  When grocery shopping, buy only the items on your grocery list and go only to the areas of the store that have the items on your list. Prepare  Some meal items can be prepared in advance. Pre-cook on days when you have extra time.  Make extra food (such as by doubling recipes) and freeze the extras in meal-sized containers or in individual portions for fast meals and snacks.  Use leftovers in your meal plan for the week.  Try some meatless meals or try "no cook" meals like salads.  When you come  home from the grocery store, wash and prepare your fruits and vegetables so they are ready to use and eat. This will help reduce food waste. How can I buy more food on a limited budget? Try these tips the next time you go shopping:  [place] store brands or generic brands.  Use coupons only for foods and brands you normally buy. Avoid buying items you wouldn't normally buy simply because they are on sale.  Check online and in newspapers for weekly deals.  Buy healthy items from the bulk bins when available, such as herbs, spices, flours, pastas, nuts, and dried fruit.  Buy fruits and  vegetables that are in season. Prices are usually lower on in-season produce.  Compare and contrast different items. You can do this by looking at the unit price on the price tag. Use it to compare different brands and sizes to find out which item is the best deal.  Choose naturally low-cost healthy items, such as carrots, potatoes, apples, bananas, and oranges. Dried or canned beans are a low-cost protein source.  Buy in bulk and freeze extra food. Items you can buy in bulk include meats, fish, poultry, frozen fruits, and frozen vegetables.  Limit the purchase of prepared or "ready-to-eat" foods, such as pre-cut fruits and vegetables and pre-made salads.  If possible, shop around to discover which grocery store offers the best prices. Some stores charge much more than other stores for the same items.  Do not shop when you are hungry. If you shop while hungry, It may be hard to stick to your list and budget.  Stick to your list and resist impulse buys. Treat your list as your official plan for the week.  Buy a variety of vegetables and fruit by purchasing fresh, frozen, and canned items.  Look beyond eye level. Foods at eye level (adult or child eye level) are more expensive. Look at the top and bottom shelves for deals.  Be efficient with your time when shopping. The more time you spend at the store, the more money you are likely to spend.  Consider other retailers such as dollar stores, larger [employer], local fruit and vegetable stands, and farmers markets.  What are some tips for less expensive food substitutions? When choosing more expensive foods like meats and dairy, try these tips to save money:  Choose cheaper cuts of meat, such as bone-in chicken thighs and drumsticks instead skinless and boneless chicken. When you are ready to prepare the chicken, you can remove the skin yourself to make it healthier.  Choose lean meats like chicken or [place]. When choosing ground beef,  make sure it is lean ground beef (92% lean, 8% fat). If you do buy a fattier ground beef, drain the fat before eating.  Buy dried beans and peas, such as lentils, split peas, or kidney beans.  For seafood, choose canned tuna, salmon, or sardines.  Eggs are a low-cost source of protein.  Buy the larger tubs of yogurt instead of individual-sized containers.  Choose water instead of sodas and other sweetened beverages.  Skip buying chips, cookies, and other "junk food". These items are usually expensive, high in calories, and low in nutritional value.  How can I prepare the foods I buy in the healthiest way? Practice these tips for cooking foods in the healthiest way to reduce excess fat and calorie intake:  Steam, saute, grill, or bake foods instead of frying them.  Make sure half your plate is filled with fruits or  vegetables. Choose from fresh, frozen, or canned fruits and vegetables. If eating canned, remember to rinse them before eating. This will remove any excess salt added for packaging.  Trim all fat from meat before cooking. Remove the skin from chicken or [place].  Spoon off fat from meat dishes once they have been chilled in the refrigerator and the fat has hardened on the top.  Use skim milk, low-fat milk, or evaporated skim milk when making cream sauces, soups, or puddings.  Substitute low-fat yogurt, sour cream, or cottage cheese for sour cream and mayonnaise in dips and dressings.  Try lemon juice, herbs, or spices to season food instead of salt, butter, or margarine.  This information is not intended to replace advice given to you by your health care provider. Make sure you discuss any questions you have with your health care provider. Document Released: 12/21/2013 Document Revised: 11/07/2015 Document Reviewed: 11/20/2013 Elsevier Interactive Patient Education  [employer].

## 2017-02-08 NOTE — Progress Notes (Deleted)
   Subjective:  Patient ID: Matthew Ponce, male    DOB: August 19, 1955  Age: 61 y.o. MRN: [id number]  CC: Abdominal Pain (lower)   HPI [facility] presents for ***  Didn't   4 to 5 hours after he eats does matter which foods Started 6 months  Blood in stool no Pencil like  burning sensation with urination  Does not eat at all ; poor appetite 1 year ago  No diabetes history  Thoracic pain few months   N/V/no dyphpagia  BM  16   PSA  CBC  Hemoccult  Xray abdomen   No history of mental disorder or family history reporst  Excessive worry Lack of sleep Poor appetitie Only drink 16 oz of water per day      Outpatient Medications Prior to Visit  Medication Sig Dispense Refill  . hydrochlorothiazide (HYDRODIURIL) 12.5 MG tablet Take 1 tablet (12.5 mg total) by mouth daily. (Patient not taking: Reported on 02/08/2017) 30 tablet 2  . hydrochlorothiazide (MICROZIDE) 12.5 MG capsule Take 1 capsule (12.5 mg total) by mouth daily. (Patient not taking: Reported on 04/14/2015) 30 capsule 1   No facility-administered medications prior to visit.     ROS Review of Systems  Review of Systems - {ros [id number]}    Objective:  BP 125/72 (BP Location: Left Arm, Patient Position: Sitting, Cuff Size: Normal)   Pulse (!) 59   Temp 97.7 F (36.5 C) (Oral)   Resp 18   Ht  (1.702 m)   Wt 129 lb 3.2 oz (58.6 kg)   SpO2 99%   BMI 20.24 kg/m   BP/Weight 02/08/2017 05/12/2016 04/14/2015  Systolic BP 125 124 137  Diastolic BP 72 79 94  Wt. (Lbs) 129.2 137.6 133.5  BMI 20.24 23.62 20.9     Physical Exam   Assessment & Plan:   Problem List Items Addressed This Visit    None    Visit Diagnoses    Diabetes mellitus screening    -  Primary   Relevant Orders   HgB A1c (Completed)      No orders of the defined types were placed in this encounter.   Follow-up: No Follow-up on file.   Lizbeth Bark FNP

## 2017-02-09 LAB — LIPID PANEL
CHOL/HDL RATIO: 3.1 ratio (ref 0.0–5.0)
Cholesterol, Total: 201 mg/dL — ABNORMAL HIGH (ref 100–199)
HDL: 65 mg/dL (ref >39)
LDL CALC: 109 mg/dL — AB (ref 0–99)
Triglycerides: 134 mg/dL (ref 0–149)
VLDL CHOLESTEROL CAL: 27 mg/dL (ref 5–40)

## 2017-02-09 LAB — CMP AND LIVER
ALK PHOS: 51 IU/L (ref 39–117)
ALT: 9 IU/L (ref 0–44)
AST: 18 IU/L (ref 0–40)
Albumin: 5 g/dL (ref 3.5–5.5)
BUN: 26 mg/dL — AB (ref 6–24)
Bilirubin Total: 1.5 mg/dL — ABNORMAL HIGH (ref 0.0–1.2)
Bilirubin, Direct: 0.24 mg/dL (ref 0.00–0.40)
CO2: 25 mmol/L (ref 20–29)
Calcium: 10.5 mg/dL — ABNORMAL HIGH (ref 8.7–10.2)
Chloride: 101 mmol/L (ref 96–106)
Creatinine, Ser: 0.95 mg/dL (ref 0.76–1.27)
GFR calc Af Amer: 102 mL/min/{1.73_m2} (ref >59)
GFR, EST NON AFRICAN AMERICAN: 88 mL/min/{1.73_m2} (ref >59)
Glucose: 93 mg/dL (ref 65–99)
Potassium: 3.7 mmol/L (ref 3.5–5.2)
SODIUM: 142 mmol/L (ref 134–144)
Total Protein: 7.9 g/dL (ref 6.0–8.5)

## 2017-02-09 LAB — CBC
HEMATOCRIT: 40.6 % (ref 37.5–51.0)
Hemoglobin: 13.7 g/dL (ref 13.0–17.7)
MCH: 30 pg (ref 26.6–33.0)
MCHC: 33.7 g/dL (ref 31.5–35.7)
MCV: 89 fL (ref 79–97)
Platelets: 210 10*3/uL (ref 150–379)
RBC: 4.57 x10E6/uL (ref 4.14–5.80)
RDW: 14.8 % (ref 12.3–15.4)
WBC: 3.8 10*3/uL (ref 3.4–10.8)

## 2017-02-09 LAB — PSA: PROSTATE SPECIFIC AG, SERUM: 0.6 ng/mL (ref 0.0–4.0)

## 2017-02-09 LAB — URINE CYTOLOGY ANCILLARY ONLY
CHLAMYDIA, DNA PROBE: NEGATIVE
Neisseria Gonorrhea: NEGATIVE
Trichomonas: NEGATIVE

## 2017-02-09 LAB — C-REACTIVE PROTEIN: CRP: 0.4 mg/L (ref 0.0–4.9)

## 2017-02-09 LAB — TSH: TSH: 8 u[IU]/mL — ABNORMAL HIGH (ref 0.450–4.500)

## 2017-02-10 NOTE — Progress Notes (Signed)
Subjective:  Patient ID: Matthew Ponce, male    DOB: 05/17/1955  Age: 61 y.o. MRN: [id number]  CC: Abdominal Pain (lower)  HPI [name] presents for abdominal complaint. He is accompanied by his friend.  He complains of lower abdominal pain. Reports onset of symptoms was 6 months ago. Associated symptoms include difficulty with bowel movements with pencil like stools, poor appetite. He also reports sensation of abdominal pain with cramping 45 hours after he eats a meal regardless of food choice, he also reports poor water intake. He states  " I only drink 16 ounces of water per day" .He denies any rectal bleeding or bloody stools nausea, vomiting, or melena. He also complains of burning sensation with urination and thoracic back pain. He reports back pain for a few months. He denies any hematuria, foul-smelling urine, penile discharge or lesions. He requests DM screening. He patient appears anxious    Outpatient Medications Prior to Visit  Medication Sig Dispense Refill  . hydrochlorothiazide (HYDRODIURIL) 12.5 MG tablet Take 1 tablet (12.5 mg total) by mouth daily. (Patient not taking: Reported on 02/08/2017) 30 tablet 2  . hydrochlorothiazide (MICROZIDE) 12.5 MG capsule Take 1 capsule (12.5 mg total) by mouth daily. (Patient not taking: Reported on 04/14/2015) 30 capsule 1   No facility-administered medications prior to visit.     ROS Review of Systems  Constitutional: Negative.   HENT: Negative.   Respiratory: Negative.   Cardiovascular: Negative.   Gastrointestinal: Positive for abdominal pain.       Change in bowel habits.  Psychiatric/Behavioral: Negative for suicidal ideas.   Objective:  BP 125/72 (BP Location: Left Arm, Patient Position: Sitting, Cuff Size: Normal)   Pulse (!) 59   Temp 97.7 F (36.5 C) (Oral)   Resp 18   Ht  (1.702 m)   Wt 129 lb 3.2 oz (58.6 kg)   SpO2 99%   BMI 20.24 kg/m   BP/Weight 02/08/2017 05/12/2016 04/14/2015  Systolic BP 125 124  137  Diastolic BP 72 79 94  Wt. (Lbs) 129.2 137.6 133.5  BMI 20.24 23.62 20.9   Physical Exam  HENT:  Mouth/Throat: Oropharynx is clear and moist.  Eyes: Pupils are equal, round, and reactive to light. Conjunctivae are normal.  Neck: Normal range of motion.  Cardiovascular: Normal rate, regular rhythm and normal heart sounds.   Pulmonary/Chest: Effort normal and breath sounds normal.  Abdominal: Soft. Bowel sounds are normal. There is tenderness (generalized).  Genitourinary: Rectum normal. Rectal exam shows no mass and guaiac negative stool. Prostate is not enlarged and not tender.  Lymphadenopathy:    He has no cervical adenopathy.  Psychiatric: His mood appears anxious. His speech is rapid and/or pressured. Thought content is paranoid.  Vitals reviewed.  Assessment & Plan:   1. Chronic abdominal pain  - Hemoccult - 1 Card (office) - PSA - DG Abd 2 Views; Future - Ambulatory referral to Gastroenterology - CMP and Liver - CBC - Lipid Panel - C-reactive protein  2. Pencilling of stools  - Hemoccult - 1 Card (office) - Ambulatory referral to Gastroenterology  3. Dysuria  - PSA - Urinalysis Dipstick - Urine cytology ancillary only  4. Essential hypertension  - Lipid Panel  5. Diabetes mellitus screening  - HgB A1c  6. Anxious mood Suspicious for underlying mental disorder. Will refer to psychiatry.  Given contact information of LCSW to follow-up as needed .  - Ambulatory referral to Psychiatry  7. Fatigue, unspecified type  - TSH  8. Acute left-sided thoracic back pain  - acetaminophen (TYLENOL) 500 MG tablet; Take 1 tablet (500 mg total) by mouth every 6 (six) hours as needed.  Dispense: 30 tablet; Refill: 0  Meds ordered this encounter  Medications  . acetaminophen (TYLENOL) 500 MG tablet    Sig: Take 1 tablet (500 mg total) by mouth every 6 (six) hours as needed.    Dispense:  30 tablet    Refill:  0    Order Specific Question:   Supervising  Provider    Answer:   Quentin Angst [id number]    Follow-up: Return in about 8 weeks (around 04/05/2017), or if symptoms worsen or fail to improve.   Lizbeth Bark FNP

## 2017-02-11 LAB — URINE CYTOLOGY ANCILLARY ONLY
Bacterial vaginitis: NEGATIVE
Candida vaginitis: NEGATIVE

## 2017-02-17 ENCOUNTER — Telehealth: Payer: Self-pay

## 2017-02-17 NOTE — Telephone Encounter (Signed)
CMA call patient regarding lab results   Patient did not answer somebody else did CMA left a message stating to call back @ the office regarding lab results

## 2017-02-17 NOTE — Telephone Encounter (Signed)
-----   Message from Lizbeth BarkMandesia R Hairston, FNP sent at 02/15/2017  4:53 PM EDT ----- Normal xray. No obstruction of the bowels. Kidney function normal Liver function normal Labs that evaluated your blood cells, fluid and electrolyte balance are normal. No signs of anemia, infection, or inflammation present. Follow up with referrals.

## 2017-02-23 ENCOUNTER — Encounter: Payer: Self-pay | Admitting: Internal Medicine

## 2017-03-08 ENCOUNTER — Ambulatory Visit: Payer: Self-pay | Attending: Family Medicine

## 2017-04-04 ENCOUNTER — Ambulatory Visit: Payer: Self-pay | Admitting: Family Medicine

## 2017-04-15 ENCOUNTER — Ambulatory Visit (INDEPENDENT_AMBULATORY_CARE_PROVIDER_SITE_OTHER): Payer: Self-pay | Admitting: Internal Medicine

## 2017-04-15 ENCOUNTER — Encounter: Payer: Self-pay | Admitting: Internal Medicine

## 2017-04-15 VITALS — BP 110/80 | HR 64 | Ht 63.5 in | Wt 130.2 lb

## 2017-04-15 DIAGNOSIS — F3289 Other specified depressive episodes: Secondary | ICD-10-CM

## 2017-04-15 DIAGNOSIS — R109 Unspecified abdominal pain: Secondary | ICD-10-CM

## 2017-04-15 DIAGNOSIS — F411 Generalized anxiety disorder: Secondary | ICD-10-CM

## 2017-04-15 DIAGNOSIS — R634 Abnormal weight loss: Secondary | ICD-10-CM

## 2017-04-15 DIAGNOSIS — R194 Change in bowel habit: Secondary | ICD-10-CM

## 2017-04-15 NOTE — Patient Instructions (Signed)
You have been scheduled for a CT scan of the abdomen and pelvis at [facility] ([address]---this is in the same building as Press photographer).   You are scheduled on 04/21/2017 at 12:00pm. You should arrive 15 minutes prior to your appointment time for registration. Please follow the written instructions below on the day of your exam:  WARNING: IF YOU ARE ALLERGIC TO IODINE/X-RAY DYE, PLEASE NOTIFY RADIOLOGY IMMEDIATELY AT [phone]! YOU WILL BE GIVEN A 13 HOUR PREMEDICATION PREP.  1) Do not eat anything after 8:00am (4 hours prior to your test) 2) You have been given 2 bottles of oral contrast to drink. The solution may taste               better if refrigerated, but do NOT add ice or any other liquid to this solution. Shake             well before drinking.    Drink 1 bottle of contrast @ 10:00am (2 hours prior to your exam)  Drink 1 bottle of contrast @ 11:00am (1 hour prior to your exam)  You may take any medications as prescribed with a small amount of water except for the following: Metformin, Glucophage, Glucovance, Avandamet, Riomet, Fortamet, Actoplus Met, Janumet, Glumetza or Metaglip. The above medications must be held the day of the exam AND 48 hours after the exam.  The purpose of you drinking the oral contrast is to aid in the visualization of your intestinal tract. The contrast solution may cause some diarrhea. Before your exam is started, you will be given a small amount of fluid to drink. Depending on your individual set of symptoms, you may also receive an intravenous injection of x-ray contrast/dye. Plan on being at [facility] for 30 minutes or long, depending on the type of exam you are having performed.  If you have any questions regarding your exam or if you need to reschedule, you may call the CT department at [phone] between the hours of 8:00 am and 5:00 pm, Monday-Friday.  You have been scheduled for an endoscopy and colonoscopy. Please  follow the written instructions given to you at your visit today. Please pick up your prep supplies at the pharmacy within the next 1-3 days. If you use inhalers (even only as needed), please bring them with you on the day of your procedure. Your physician has requested that you go to www.startemmi.com and enter the access code given to you at your visit today. This web site gives a general overview about your procedure. However, you should still follow specific instructions given to you by our office regarding your preparation for the procedure.   ________________________________________________________________________

## 2017-04-15 NOTE — Progress Notes (Signed)
HISTORY OF PRESENT ILLNESS:  Matthew Ponce is a 61 y.o. male , unemployed native of [place], who is referred today by the [place], [place] regarding abdominal pain, weight loss, and change in bowel habits. The patient lives in a local [place]. He is accompanied today by a friend who participates during the encounter. The patient was recently evaluated 02/08/2017 by Ms. Hairston. I have reviewed that encounter. Comprehensive metabolic panel was unremarkable. Mild lipid abnormalities. Normal CBC. Mildly elevated TSH 8.0. Normal hemoglobin A1c 5.2. Normal PSA. Patient also underwent plain films of the abdomen on 02/09/2017. These were unremarkable. The patient describes his chief complaint as "a lot of pain in the colon area". He describes generalized abdominal discomfort throughout. He states that this is been going on for 6 months. He describes the pain as daily and occurring constantly for hours. He states that sometimes eating exacerbates the discomfort and sometimes not eating exacerbates the discomfort. He cannot find any relieving factors area she tells me that he has lost 60 pounds over the past 3 years. He tells me that most of this was early on in that his weight has been more stable over the past 6 months. His friend contradicts that statement. Patient reports to having one bowel movement every day or every other day. Describes the caliber is more thin than previous. No bleeding. Outside Hemoccult studies were negative. Urinalysis negative. Patient is said to have anxiety and depression. Poor appetite. Possible swallowing difficulties versus dry mouth. On no regular medications. Denies prior GI evaluations. Family history uncertain.  REVIEW OF SYSTEMS:  All non-GI ROS negative unless otherwise stated in the history of present illness except for anxiety, back pain, visual change, confusion, cough, depression, itching, muscle cramps, shortness of breath, voice  change, urinary frequency, increased thirst, swollen legs, sore throat,  Past Medical History:  Diagnosis Date  . Anxiety   . Asthma   . Depression   . HTN (hypertension)   . Sleep apnea     Past Surgical History:  Procedure Laterality Date  . REFRACTIVE SURGERY Bilateral     Social History Matthew Ponce  reports that  has never smoked. he has never used smokeless tobacco. He reports that he does not drink alcohol or use drugs.  Family history is unknown by patient.  No Known Allergies     PHYSICAL EXAMINATION: Vital signs: BP 110/80 (BP Location: Left Arm, Patient Position: Sitting, Cuff Size: Normal)   Pulse 64   Ht 5' 3.5" (1.613 m) Comment: height measured without shoes  Wt 130 lb 4 oz (59.1 kg)   BMI 22.71 kg/m   Constitutional: Thin somewhat disheveled-appearing, no acute distress Psychiatric: alert and oriented x3, cooperative but anxious, possibly depressed-appearing Eyes: extraocular movements intact, anicteric, conjunctiva pink Mouth: oral pharynx moist, no lesions Neck: supple without thyromegaly Lymph: no lymphadenopathy Cardiovascular: heart regular rate and rhythm, no murmur Lungs: clear to auscultation bilaterally Abdomen: soft, nontender, nondistended, no obvious ascites, no peritoneal signs, normal bowel sounds, no organomegaly Rectal: Deferred until colonoscopy Extremities: no clubbing, cyanosis, or lower extremity edema bilaterally. There are chronic stasis changes bilaterally Skin: no additional lesions on visible extremities Neuro: No focal deficits. Cranial nerves intact. Normal DTRs. No asterixis.   ASSESSMENT:  #1. Six-month history of chronic abdominal pain. Etiology uncertain. With weight loss, malignancy is of concern. #2. Weight loss. Apparently, 30 pounds over 3 years. Timing of weight loss in question #3. Change in bowel habits and stool  caliber. Rule out colon cancer given weight loss #4. Anxiety/depression. Could be playing into  complaints #5. Mildly elevated TSH. Referred back to PCP  PLAN:  #1. Upper endoscopy. Rule out gastric cancer or ulcer.The nature of the procedure, as well as the risks, benefits, and alternatives were carefully and thoroughly reviewed with the patient. Ample time for discussion and questions allowed. The patient understood, was satisfied, and agreed to proceed. #2. Colonoscopy. Rule out colon cancer.The nature of the procedure, as well as the risks, benefits, and alternatives were carefully and thoroughly reviewed with the patient. Ample time for discussion and questions allowed. The patient understood, was satisfied, and agreed to proceed. He was provided with a free colonoscopy prep kit as he has no insurance. #3. Contrast-enhanced CT scan of the abdomen and pelvis. Rule out intra-abdominal malignancy #4. Return to PCP regarding issues with anxiety and depression as well as monitoring thyroid function. If GI workup is unrevealing, then this may be the essence of his issues  A copy of this consultation note has been sent to Gean Quint, FNP

## 2017-04-21 ENCOUNTER — Ambulatory Visit (HOSPITAL_COMMUNITY)
Admission: RE | Admit: 2017-04-21 | Discharge: 2017-04-21 | Disposition: A | Discharge status: Home / Self Care | Payer: Self-pay | Source: Ambulatory Visit | Attending: Internal Medicine | Admitting: Internal Medicine

## 2017-04-21 ENCOUNTER — Encounter (HOSPITAL_COMMUNITY): Payer: Self-pay

## 2017-04-21 DIAGNOSIS — R109 Unspecified abdominal pain: Secondary | ICD-10-CM | POA: Insufficient documentation

## 2017-04-21 MED ORDER — IOPAMIDOL (ISOVUE-300) INJECTION 61%
INTRAVENOUS | Status: AC
  Administered 2017-04-21: 100 mL via INTRAVENOUS
  Filled 2017-04-21: qty 100

## 2017-04-21 MED ORDER — IOPAMIDOL (ISOVUE-300) INJECTION 61%
100.0000 mL | Freq: Once | INTRAVENOUS | Status: AC | PRN
  Administered 2017-04-21: 100 mL via INTRAVENOUS

## 2017-05-18 ENCOUNTER — Ambulatory Visit: Payer: Self-pay | Attending: Family Medicine

## 2017-05-23 ENCOUNTER — Ambulatory Visit: Payer: Self-pay | Admitting: Family Medicine

## 2017-05-25 ENCOUNTER — Ambulatory Visit: Payer: Self-pay | Attending: Family Medicine | Admitting: Physician Assistant

## 2017-05-25 VITALS — BP 116/78 | HR 60 | Temp 97.8°F | Resp 16 | Wt 128.8 lb

## 2017-05-25 DIAGNOSIS — J45909 Unspecified asthma, uncomplicated: Secondary | ICD-10-CM | POA: Insufficient documentation

## 2017-05-25 DIAGNOSIS — Z590 Homelessness: Secondary | ICD-10-CM | POA: Insufficient documentation

## 2017-05-25 DIAGNOSIS — Z79899 Other long term (current) drug therapy: Secondary | ICD-10-CM | POA: Insufficient documentation

## 2017-05-25 DIAGNOSIS — F419 Anxiety disorder, unspecified: Secondary | ICD-10-CM | POA: Insufficient documentation

## 2017-05-25 DIAGNOSIS — R21 Rash and other nonspecific skin eruption: Secondary | ICD-10-CM

## 2017-05-25 DIAGNOSIS — G4730 Sleep apnea, unspecified: Secondary | ICD-10-CM | POA: Insufficient documentation

## 2017-05-25 DIAGNOSIS — F329 Major depressive disorder, single episode, unspecified: Secondary | ICD-10-CM | POA: Insufficient documentation

## 2017-05-25 DIAGNOSIS — Z20828 Contact with and (suspected) exposure to other viral communicable diseases: Secondary | ICD-10-CM | POA: Insufficient documentation

## 2017-05-25 DIAGNOSIS — I10 Essential (primary) hypertension: Secondary | ICD-10-CM | POA: Insufficient documentation

## 2017-05-25 DIAGNOSIS — L309 Dermatitis, unspecified: Secondary | ICD-10-CM | POA: Insufficient documentation

## 2017-05-25 DIAGNOSIS — L0390 Cellulitis, unspecified: Secondary | ICD-10-CM

## 2017-05-25 DIAGNOSIS — Z23 Encounter for immunization: Secondary | ICD-10-CM | POA: Insufficient documentation

## 2017-05-25 MED ORDER — DOXYCYCLINE HYCLATE 100 MG PO TABS: 100.0000 mg | ORAL_TABLET | Freq: Two times a day (BID) | ORAL | 0 refills | Status: DC

## 2017-05-25 MED ORDER — TRIAMCINOLONE ACETONIDE 0.1 % EX CREA: 1.0000 "application " | TOPICAL_CREAM | Freq: Two times a day (BID) | CUTANEOUS | 2 refills | Status: DC

## 2017-05-25 MED ORDER — OSELTAMIVIR PHOSPHATE 75 MG PO CAPS: 75.0000 mg | ORAL_CAPSULE | Freq: Every day | ORAL | 0 refills | Status: DC

## 2017-05-25 MED FILL — ?DOXYCYCLINE 100MG TABLET: 100 | 10 days supply | Qty: 20 | Fill #0

## 2017-05-25 MED FILL — ?OSELTAMIVIR PHOS 75 MG CAP: 75 | 10 days supply | Qty: 10 | Fill #0

## 2017-05-25 MED FILL — TRIAMCINOLONE ACETONIDE 0.1: 0.1 | 20 days supply | Qty: 80 | Fill #0

## 2017-05-25 NOTE — Patient Instructions (Addendum)
Clean area once daily with soap and water the re-dress.    Influenza Virus Vaccine injection (Fluarix) What is this medicine? INFLUENZA VIRUS VACCINE (in floo EN zuh VAHY ruhs vak SEEN) helps to reduce the risk of getting influenza also known as the flu. This medicine may be used for other purposes; ask your health care provider or pharmacist if you have questions. COMMON BRAND NAME(S): Fluarix, Fluzone What should I tell my health care provider before I take this medicine? They need to know if you have any of these conditions: -bleeding disorder like hemophilia -fever or infection -Guillain-Barre syndrome or other neurological problems -immune system problems -infection with the human immunodeficiency virus (HIV) or AIDS -low blood platelet counts -multiple sclerosis -an unusual or allergic reaction to influenza virus vaccine, eggs, chicken proteins, latex, gentamicin, other medicines, foods, dyes or preservatives -pregnant or trying to get pregnant -breast-feeding How should I use this medicine? This vaccine is for injection into a muscle. It is given by a health care professional. A copy of Vaccine Information Statements will be given before each vaccination. Read this sheet carefully each time. The sheet may change frequently. Talk to your pediatrician regarding the use of this medicine in children. Special care may be needed. Overdosage: If you think you have taken too much of this medicine contact a poison control center or emergency room at once. NOTE: This medicine is only for you. Do not share this medicine with others. What if I miss a dose? This does not apply. What may interact with this medicine? -chemotherapy or radiation therapy -medicines that lower your immune system like etanercept, anakinra, infliximab, and adalimumab -medicines that treat or prevent blood clots like warfarin -phenytoin -steroid medicines like prednisone or cortisone -theophylline -vaccines This  list may not describe all possible interactions. Give your health care provider a list of all the medicines, herbs, non-prescription drugs, or dietary supplements you use. Also tell them if you smoke, drink alcohol, or use illegal drugs. Some items may interact with your medicine. What should I watch for while using this medicine? Report any side effects that do not go away within 3 days to your doctor or health care professional. Call your health care provider if any unusual symptoms occur within 6 weeks of receiving this vaccine. You may still catch the flu, but the illness is not usually as bad. You cannot get the flu from the vaccine. The vaccine will not protect against colds or other illnesses that may cause fever. The vaccine is needed every year. What side effects may I notice from receiving this medicine? Side effects that you should report to your doctor or health care professional as soon as possible: -allergic reactions like skin rash, itching or hives, swelling of the face, lips, or tongue Side effects that usually do not require medical attention (report to your doctor or health care professional if they continue or are bothersome): -fever -headache -muscle aches and pains -pain, tenderness, redness, or swelling at site where injected -weak or tired This list may not describe all possible side effects. Call your doctor for medical advice about side effects. You may report side effects to FDA at 1-800-FDA-1088. Where should I keep my medicine? This vaccine is only given in a clinic, pharmacy, doctor's office, or other health care setting and will not be stored at home. NOTE: This sheet is a summary. It may not cover all possible information. If you have questions about this medicine, talk to your doctor, pharmacist, or health  care provider.  2018 Elsevier/Gold Standard (2007-11-15 09:30:40)

## 2017-05-25 NOTE — Progress Notes (Signed)
Patient ID: Matthew Ponce, male   DOB: 1955-12-29, 62 y.o.   MRN: [id number]   Matthew Ponce, is a 62 y.o. male  [id number]  [id number]  DOB - Mar 26, 1956  Subjective:  Chief Complaint and HPI: Matthew Ponce is a 62 y.o. male here today with pruritic rash that started about 3 weeks ago.  + itching.  One area on R arm started weeping a few days ago.  [place] and is essentially homeless, but they do provide him food/shelter.  No contacts with rash.   No f/c. No rash anywhere else.  He does suffer with dry skin.  Has been exposed to someone with flu/had immediate contact with them.  No s/sx   ROS:   Constitutional:  No f/c, No night sweats, No unexplained weight loss. EENT:  No vision changes, No blurry vision, No hearing changes. No mouth, throat, or ear problems.  Respiratory: No cough, No SOB Cardiac: No CP, no palpitations GI:  No abd pain, No N/V/D. GU: No Urinary s/sx Musculoskeletal: No joint pain Neuro: No headache, no dizziness, no motor weakness.  Skin:+rash Endocrine:  No polydipsia. No polyuria.  Psych: Denies SI/HI  No problems updated.  ALLERGIES: No Known Allergies  PAST MEDICAL HISTORY: Past Medical History:  Diagnosis Date  . Anxiety   . Asthma   . Depression   . HTN (hypertension)   . Sleep apnea     MEDICATIONS AT HOME: Prior to Admission medications   Medication Sig Start Date End Date Taking? Authorizing Provider  doxycycline (VIBRA-TABS) 100 MG tablet Take 1 tablet (100 mg total) by mouth 2 (two) times daily. 05/25/17   Anders Simmonds, PA-C  oseltamivir (TAMIFLU) 75 MG capsule Take 1 capsule (75 mg total) by mouth daily. For flu prophylaxis 05/25/17   Anders Simmonds, PA-C  triamcinolone cream (KENALOG) 0.1 % Apply 1 application topically 2 (two) times daily. To dry itchy skin 05/25/17   Anders Simmonds, PA-C     Objective:  EXAM:   Vitals:   05/25/17 1055  BP: 116/78  Pulse: 60  Resp: 16  Temp: 97.8 F (36.6 C)    TempSrc: Oral  SpO2: 97%  Weight: 128 lb 12.8 oz (58.4 kg)    General appearance : A&OX3. NAD. Non-toxic-appearing HEENT: Atraumatic and Normocephalic.  PERRLA. EOM intact.   Neck: supple, no JVD. No cervical lymphadenopathy. No thyromegaly Chest/Lungs:  Breathing-non-labored, Good air entry bilaterally, breath sounds normal without rales, rhonchi, or wheezing  CVS: S1 S2 regular, no murmurs, gallops, rubs  Extremities: Bilateral Lower Ext shows no edema, both legs are warm to touch with = pulse throughout Neurology:  CN II-XII grossly intact, Non focal.   Psych:  TP linear. J/I WNL. Normal speech. Appropriate eye contact and affect.  Skin:  Dry skin on L hand/wrist and forearm.  R forearm aspect with open erythematous area and occasional bulla with mild swelling-area is about 6X8cm.  No other rash  Data Review Lab Results  Component Value Date   HGBA1C 5.2 02/08/2017   HGBA1C 5.1 05/12/2016     Assessment & Plan   1. Rash Eczema on L hand - triamcinolone cream (KENALOG) 0.1 %; Apply 1 application topically 2 (two) times daily. To dry itchy skin  Dispense: 80 g; Refill: 2  2. Essential hypertension Controlled-continue current regimen  3. Needs flu shot - Flu Vaccine QUAD 6+ mos PF IM (Fluarix Quad PF)  4. Cellulitis, unspecified cellulitis site Likely secondary to scratching at  eczema - doxycycline (VIBRA-TABS) 100 MG tablet; Take 1 tablet (100 mg total) by mouth 2 (two) times daily.  Dispense: 20 tablet; Refill: 0 Cleansed wound and wrapped with telfa, kerlex, then ace wrap.  Wound care reviewed.    5. Exposure to the flu - oseltamivir (TAMIFLU) 75 MG capsule; Take 1 capsule (75 mg total) by mouth daily. For flu prophylaxis  Dispense: 10 capsule; Refill: 0     Patient have been counseled extensively about nutrition and exercise  Return in about 1 week (around 06/01/2017) for appointment with me to recheck rash.  The patient was given clear instructions to go to ER  or return to medical center if symptoms don't improve, worsen or new problems develop. The patient verbalized understanding. The patient was told to call to get lab results if they haven't heard anything in the next week.     Georgian CoAngela Luara Faye, PA-C [facility], [place] [phone]   05/25/2017, 11:24 AM

## 2017-06-01 ENCOUNTER — Ambulatory Visit: Payer: Self-pay | Attending: Family Medicine | Admitting: Physician Assistant

## 2017-06-01 VITALS — BP 117/80 | HR 60 | Temp 97.5°F | Resp 16 | Wt 125.6 lb

## 2017-06-01 DIAGNOSIS — Z79899 Other long term (current) drug therapy: Secondary | ICD-10-CM | POA: Insufficient documentation

## 2017-06-01 DIAGNOSIS — L0390 Cellulitis, unspecified: Secondary | ICD-10-CM

## 2017-06-01 DIAGNOSIS — I10 Essential (primary) hypertension: Secondary | ICD-10-CM

## 2017-06-01 DIAGNOSIS — L03113 Cellulitis of right upper limb: Secondary | ICD-10-CM | POA: Insufficient documentation

## 2017-06-01 NOTE — Progress Notes (Signed)
Rashid Whitenight, is a 62 y.o. male  ZOX:[id number]  [id number]  DOB - 1956/01/22  Subjective:  Chief Complaint and HPI: Korion Cuevas is a 62 y.o. male here today to recheck R arm cellulitis.  Doing much better.  No fever/chills.    ROS:   Constitutional:  No f/c, No night sweats, No unexplained weight loss. EENT:  No vision changes, No blurry vision, No hearing changes. No mouth, throat, or ear problems.  Respiratory: No cough, No SOB Cardiac: No CP, no palpitations GI:  No abd pain, No N/V/D. GU: No Urinary s/sx Musculoskeletal: No joint pain Neuro: No headache, no dizziness, no motor weakness.  Skin: healing cellulitis and eczema Endocrine:  No polydipsia. No polyuria.  Psych: Denies SI/HI  No problems updated.  ALLERGIES: No Known Allergies  PAST MEDICAL HISTORY: Past Medical History:  Diagnosis Date  . Anxiety   . Asthma   . Depression   . HTN (hypertension)   . Sleep apnea     MEDICATIONS AT HOME: Prior to Admission medications   Medication Sig Start Date End Date Taking? Authorizing Provider  doxycycline (VIBRA-TABS) 100 MG tablet Take 1 tablet (100 mg total) by mouth 2 (two) times daily. 05/25/17   Anders Simmonds, PA-C  oseltamivir (TAMIFLU) 75 MG capsule Take 1 capsule (75 mg total) by mouth daily. For flu prophylaxis 05/25/17   Anders Simmonds, PA-C  triamcinolone cream (KENALOG) 0.1 % Apply 1 application topically 2 (two) times daily. To dry itchy skin 05/25/17   Anders Simmonds, PA-C     Objective:  EXAM:   Vitals:   06/01/17 1128  BP: 117/80  Pulse: 60  Resp: 16  Temp: (!) 97.5 F (36.4 C)  TempSrc: Oral  SpO2: 99%  Weight: 125 lb 9.6 oz (57 kg)    General appearance : A&OX3. NAD. Non-toxic-appearing HEENT: Atraumatic and Normocephalic.  PERRLA. EOM intact.  Neck: supple, no JVD. No cervical lymphadenopathy. No thyromegaly Chest/Lungs:  Breathing-non-labored, Good air entry bilaterally, breath sounds normal without rales,  rhonchi, or wheezing  CVS: S1 S2 regular, no murmurs, gallops, rubs  Extremities: Bilateral Lower Ext shows no edema, both legs are warm to touch with = pulse throughout Neurology:  CN II-XII grossly intact, Non focal.   Psych:  TP linear. J/I WNL. Normal speech. Appropriate eye contact and affect.  Skin:  R volar forearm area of about 4X7cm of erythema and some surrounding dry skin; no open sores or lesions now and skin is warm, dry, and intact without oozing or draining.  Much improved over last week.  Data Review Lab Results  Component Value Date   HGBA1C 5.2 02/08/2017   HGBA1C 5.1 05/12/2016     Assessment & Plan   1. Cellulitis, unspecified cellulitis site Much improved-no open skin now!  Continue with skin care.  Healthy diet and hydration imperative(patient has a difficult time adhering to either)  2. Hypertension, unspecified type Controlled.  Continue current regimen   Patient have been counseled extensively about nutrition and exercise  Return in about 1 month (around 06/30/2017) for [place]; BP and other medical issues.  The patient was given clear instructions to go to ER or return to medical center if symptoms don't improve, worsen or new problems develop. The patient verbalized understanding. The patient was told to call to get lab results if they haven't heard anything in the next week.     Georgian Co, PA-C [facility] [place], [place] [phone]  06/01/2017, 12:51 PMPatient ID: Patria ManeAkram Grewell, male   DOB: 27-Mar-1956, 62 y.o.   MRN: [id number]

## 2017-06-07 ENCOUNTER — Ambulatory Visit (AMBULATORY_SURGERY_CENTER): Payer: Self-pay | Admitting: Internal Medicine

## 2017-06-07 ENCOUNTER — Encounter: Payer: Self-pay | Admitting: Internal Medicine

## 2017-06-07 ENCOUNTER — Other Ambulatory Visit: Payer: Self-pay

## 2017-06-07 VITALS — BP 113/68 | HR 50 | Temp 97.8°F | Resp 13 | Ht 63.0 in | Wt 130.0 lb

## 2017-06-07 DIAGNOSIS — R634 Abnormal weight loss: Secondary | ICD-10-CM

## 2017-06-07 DIAGNOSIS — K2990 Gastroduodenitis, unspecified, without bleeding: Secondary | ICD-10-CM

## 2017-06-07 DIAGNOSIS — K2970 Gastritis, unspecified, without bleeding: Secondary | ICD-10-CM

## 2017-06-07 DIAGNOSIS — K253 Acute gastric ulcer without hemorrhage or perforation: Secondary | ICD-10-CM

## 2017-06-07 DIAGNOSIS — R194 Change in bowel habit: Secondary | ICD-10-CM

## 2017-06-07 DIAGNOSIS — R109 Unspecified abdominal pain: Secondary | ICD-10-CM

## 2017-06-07 MED ORDER — SODIUM CHLORIDE 0.9 % IV SOLN: 500.0000 mL | Freq: Once | INTRAVENOUS | Status: AC

## 2017-06-07 MED ORDER — OMEPRAZOLE 40 MG PO CPDR: 40.0000 mg | DELAYED_RELEASE_CAPSULE | Freq: Every day | ORAL | 6 refills | Status: DC

## 2017-06-07 MED FILL — OMEPRAZOLE DR 40 MG CAPSULE: 40 | 30 days supply | Qty: 30 | Fill #0

## 2017-06-07 NOTE — Progress Notes (Signed)
Patient has large red, raised rash on right forearm, states was given antibiotic and cream for the rash, it is better now. No drainage noted.

## 2017-06-07 NOTE — Progress Notes (Signed)
Called to room to assist during endoscopic procedure.  Patient ID and intended procedure confirmed with present staff. Received instructions for my participation in the procedure from the performing physician.  

## 2017-06-07 NOTE — Op Note (Signed)
[facility] Patient Name: Matthew Ponce Procedure Date: 06/07/2017 1:23 PM MRN: [id number] Endoscopist: Wilhemina Bonito. Marina Goodell , MD Age: 62 Referring MD:  Date of Birth: Dec 17, 1955 Gender: Male Account #: [id number] Procedure:                Colonoscopy Indications:              Abdominal pain, Change in bowel habits, Change in                            stool caliber, Weight loss Medicines:                Monitored Anesthesia Care Procedure:                Pre-Anesthesia Assessment:                           - Prior to the procedure, a History and Physical                            was performed, and patient medications and                            allergies were reviewed. The patient's tolerance of                            previous anesthesia was also reviewed. The risks                            and benefits of the procedure and the sedation                            options and risks were discussed with the patient.                            All questions were answered, and informed consent                            was obtained. Prior Anticoagulants: The patient has                            taken no previous anticoagulant or antiplatelet                            agents. ASA Grade Assessment: II - A patient with                            mild systemic disease. After reviewing the risks                            and benefits, the patient was deemed in                            satisfactory condition to undergo the procedure.  After obtaining informed consent, the colonoscope                            was passed under direct vision. Throughout the                            procedure, the patient's blood pressure, pulse, and                            oxygen saturations were monitored continuously. The                            Colonoscope was introduced through the anus and                            advanced to the the cecum, identified  by                            appendiceal orifice and ileocecal valve. The                            ileocecal valve, appendiceal orifice, and rectum                            were photographed. The quality of the bowel                            preparation was excellent. The colonoscopy was                            performed without difficulty. The patient tolerated                            the procedure well. The bowel preparation used was                            SUPREP. 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                           Internal hemorrhoids were found during                            retroflexion. The hemorrhoids were mild.                           The exam was otherwise without abnormality on                            direct and retroflexion views. Complications:            No immediate  complications. Estimated blood loss:                            None. Estimated Blood Loss:     Estimated blood loss: none. Impression:               - Melanosis in the colon.                           - Internal hemorrhoids.                           - The examination was otherwise normal on direct                            and retroflexion views.                           - Abdominal discomfort related to constipation and                            bloating                           - EGD today. Please see report for findings and                            recommendations. Recommendation:           - Repeat colonoscopy in 10 years for screening                            purposes.                           - Patient has a contact number available for                            emergencies. The signs and symptoms of potential                            delayed complications were  discussed with the                            patient. Return to normal activities tomorrow.                            Written discharge instructions were provided to the                            patient.                           - Resume previous diet.                           - Continue present medications. Wilhemina Bonito. Marina Goodell, MD 06/07/2017 1:58:21 PM This report has been signed electronically.

## 2017-06-07 NOTE — Progress Notes (Signed)
Report to PACU, RN, vss, BBS= Clear.  

## 2017-06-07 NOTE — Progress Notes (Signed)
Pt's states no medical or surgical changes since previsit or office visit. 

## 2017-06-07 NOTE — Op Note (Signed)
[facility] Patient Name: Matthew Ponce Procedure Date: 06/07/2017 1:23 PM MRN: [id number] Endoscopist: Wilhemina Bonito. Marina Goodell , MD Age: 62 Referring MD:  Date of Birth: 02-04-1956 Gender: Male Account #: [id number] Procedure:                Upper GI endoscopy, with biopsies Indications:              Abdominal pain, Weight loss Medicines:                Monitored Anesthesia Care Procedure:                Pre-Anesthesia Assessment:                           - Prior to the procedure, a History and Physical                            was performed, and patient medications and                            allergies were reviewed. The patient's tolerance of                            previous anesthesia was also reviewed. The risks                            and benefits of the procedure and the sedation                            options and risks were discussed with the patient.                            All questions were answered, and informed consent                            was obtained. Prior Anticoagulants: The patient has                            taken no previous anticoagulant or antiplatelet                            agents. ASA Grade Assessment: II - A patient with                            mild systemic disease. After reviewing the risks                            and benefits, the patient was deemed in                            satisfactory condition to undergo the procedure.                           After obtaining informed consent, the endoscope was  passed under direct vision. Throughout the                            procedure, the patient's blood pressure, pulse, and                            oxygen saturations were monitored continuously. The                            Endoscope was introduced through the mouth, and                            advanced to the second part of duodenum. The upper                            GI endoscopy was  accomplished without difficulty.                            The patient tolerated the procedure well. Scope In: Scope Out: Findings:                 The esophagus was normal.                           Patchy moderately erythematous mucosa was found in                            the entire examined stomach, as well as a few                            scattered erosions. Biopsies were taken with a cold                            forceps for Helicobacter pylori testing using                            CLOtest. Stomach was otherwise normal.                           The examined duodenum was normal.                           The cardia and gastric fundus were normal on                            retroflexion. Complications:            No immediate complications. Estimated Blood Loss:     Estimated blood loss: none. Impression:               - Normal esophagus.                           - Erythematous mucosa in the stomach with a few  erosions consistent with gastritis. Biopsied.                           - Normal examined duodenum. Recommendation:           - Patient has a contact number available for                            emergencies. The signs and symptoms of potential                            delayed complications were discussed with the                            patient. Return to normal activities tomorrow.                            Written discharge instructions were provided to the                            patient.                           - Resume previous diet.                           - Continue present medications.                           - Await pathology results. Treat for Helicobacter                            pylori if CLO test positive                           - Prescribe omeprazole 40 mg daily; #30; 6 refills                           - For your bowels you may take MiraLAX (or                            GlycoLax) one or 2 scoops  daily and 14 ounces of                            water                           - Routine office follow-up with Marina GoodellPerry in about 6                            weeks                           - Returned your primary care provider regarding  diabetes, thyroid, and other nongastrointestinal                            issues. Wilhemina Bonito. Marina Goodell, MD 06/07/2017 2:04:03 PM This report has been signed electronically.

## 2017-06-07 NOTE — Patient Instructions (Signed)
Handouts given for Hemorrhoids, post esophageal dilation diet and stricture.  YOU HAD AN ENDOSCOPIC PROCEDURE TODAY AT THE [facility]:   Refer to the procedure report that was given to you for any specific questions about what was found during the examination.  If the procedure report does not answer your questions, please call your gastroenterologist to clarify.  If you requested that your care partner not be given the details of your procedure findings, then the procedure report has been included in a sealed envelope for you to review at your convenience later.  YOU SHOULD EXPECT: Some feelings of bloating in the abdomen. Passage of more gas than usual.  Walking can help get rid of the air that was put into your GI tract during the procedure and reduce the bloating. If you had a lower endoscopy (such as a colonoscopy or flexible sigmoidoscopy) you may notice spotting of blood in your stool or on the toilet paper. If you underwent a bowel prep for your procedure, you may not have a normal bowel movement for a few days.  Please Note:  You might notice some irritation and congestion in your nose or some drainage.  This is from the oxygen used during your procedure.  There is no need for concern and it should clear up in a day or so.  SYMPTOMS TO REPORT IMMEDIATELY:   Following lower endoscopy (colonoscopy or flexible sigmoidoscopy):  Excessive amounts of blood in the stool  Significant tenderness or worsening of abdominal pains  Swelling of the abdomen that is new, acute  Fever of 100F or higher   Following upper endoscopy (EGD)  Vomiting of blood or coffee ground material  New chest pain or pain under the shoulder blades  Painful or persistently difficult swallowing  New shortness of breath  Fever of 100F or higher  Black, tarry-looking stools  For urgent or emergent issues, a gastroenterologist can be reached at any hour by calling [phone].   DIET:  We do  recommend a small meal at first, but then you may proceed to your regular diet.  Drink plenty of fluids but you should avoid alcoholic beverages for 24 hours.  ACTIVITY:  You should plan to take it easy for the rest of today and you should NOT DRIVE or use heavy machinery until tomorrow (because of the sedation medicines used during the test).    FOLLOW UP: Our staff will call the number listed on your records the next business day following your procedure to check on you and address any questions or concerns that you may have regarding the information given to you following your procedure. If we do not reach you, we will leave a message.  However, if you are feeling well and you are not experiencing any problems, there is no need to return our call.  We will assume that you have returned to your regular daily activities without incident.  If any biopsies were taken you will be contacted by phone or by letter within the next 1-3 weeks.  Please call us at [phone] if you have not heard about the biopsies in 3 weeks.    SIGNATURES/CONFIDENTIALITY: You and/or your care partner have signed paperwork which will be entered into your electronic medical record.  These signatures attest to the fact that that the information above on your After Visit Summary has been reviewed and is understood.  Full responsibility of the confidentiality of this discharge information lies with you and/or your care-partner.

## 2017-06-08 ENCOUNTER — Telehealth: Payer: Self-pay

## 2017-06-08 LAB — HELICOBACTER PYLORI SCREEN-BIOPSY: UREASE: POSITIVE — AB

## 2017-06-08 NOTE — Telephone Encounter (Signed)
  Follow up Call-  Call back number 06/07/2017  Post procedure Call Back phone  # [phone] Nick's number (friend)  Permission to leave phone message Yes  Some recent data might be hidden     Patient questions:  Do you have a fever, pain , or abdominal swelling? No. Pain Score  0 *  Have you tolerated food without any problems? Yes.    Have you been able to return to your normal activities? Yes.    Do you have any questions about your discharge instructions: Diet   No. Medications  No. Follow up visit  No.  Do you have questions or concerns about your Care? No.  Actions: * If pain score is 4 or above: No action needed, pain <4.

## 2017-06-09 ENCOUNTER — Telehealth: Payer: Self-pay | Admitting: Internal Medicine

## 2017-06-09 ENCOUNTER — Other Ambulatory Visit: Payer: Self-pay

## 2017-06-09 MED ORDER — BIS SUBCIT-METRONID-TETRACYC 140-125-125 MG PO CAPS: 3.0000 | ORAL_CAPSULE | Freq: Three times a day (TID) | ORAL | 0 refills | Status: DC

## 2017-06-13 MED ORDER — BISMUTH SUBSALICYLATE 262 MG PO TABS: 2.0000 | ORAL_TABLET | Freq: Two times a day (BID) | ORAL | 0 refills | Status: DC

## 2017-06-13 MED ORDER — METRONIDAZOLE 250 MG PO TABS: 250.0000 mg | ORAL_TABLET | Freq: Two times a day (BID) | ORAL | 0 refills | Status: DC

## 2017-06-13 MED ORDER — DOXYCYCLINE HYCLATE 50 MG PO CAPS: 100.0000 mg | ORAL_CAPSULE | Freq: Two times a day (BID) | ORAL | 0 refills | Status: DC

## 2017-06-13 NOTE — Telephone Encounter (Signed)
Sent breakdown of generic H Pylori treatment to pharmacy

## 2017-07-20 ENCOUNTER — Ambulatory Visit: Payer: Self-pay | Admitting: Internal Medicine

## 2017-07-21 MED FILL — OMEPRAZOLE DR 40 MG CAPSULE: 40 | 30 days supply | Qty: 30 | Fill #1

## 2017-07-21 MED FILL — DOXYCYCLINE HYC 50 MG CAP: 50 | 10 days supply | Qty: 40 | Fill #0

## 2017-07-21 MED FILL — metroNIDAZOLE 250 MG TABS: 250 | 10 days supply | Qty: 20 | Fill #0

## 2017-07-27 ENCOUNTER — Other Ambulatory Visit: Payer: Self-pay

## 2017-07-27 ENCOUNTER — Ambulatory Visit: Payer: Self-pay | Attending: Family Medicine | Admitting: Physician Assistant

## 2017-07-27 VITALS — BP 109/75 | HR 64 | Temp 97.3°F | Resp 16 | Wt 125.0 lb

## 2017-07-27 DIAGNOSIS — R001 Bradycardia, unspecified: Secondary | ICD-10-CM | POA: Insufficient documentation

## 2017-07-27 DIAGNOSIS — F329 Major depressive disorder, single episode, unspecified: Secondary | ICD-10-CM | POA: Insufficient documentation

## 2017-07-27 DIAGNOSIS — F419 Anxiety disorder, unspecified: Secondary | ICD-10-CM | POA: Insufficient documentation

## 2017-07-27 DIAGNOSIS — L309 Dermatitis, unspecified: Secondary | ICD-10-CM

## 2017-07-27 DIAGNOSIS — Z7982 Long term (current) use of aspirin: Secondary | ICD-10-CM | POA: Insufficient documentation

## 2017-07-27 DIAGNOSIS — J45909 Unspecified asthma, uncomplicated: Secondary | ICD-10-CM | POA: Insufficient documentation

## 2017-07-27 DIAGNOSIS — I10 Essential (primary) hypertension: Secondary | ICD-10-CM | POA: Insufficient documentation

## 2017-07-27 DIAGNOSIS — Z79899 Other long term (current) drug therapy: Secondary | ICD-10-CM | POA: Insufficient documentation

## 2017-07-27 DIAGNOSIS — M546 Pain in thoracic spine: Secondary | ICD-10-CM | POA: Insufficient documentation

## 2017-07-27 DIAGNOSIS — R079 Chest pain, unspecified: Secondary | ICD-10-CM

## 2017-07-27 DIAGNOSIS — G4730 Sleep apnea, unspecified: Secondary | ICD-10-CM | POA: Insufficient documentation

## 2017-07-27 DIAGNOSIS — R1013 Epigastric pain: Secondary | ICD-10-CM

## 2017-07-27 MED ORDER — ASPIRIN EC 81 MG PO TBEC: 81.0000 mg | DELAYED_RELEASE_TABLET | Freq: Every day | ORAL | 1 refills | Status: DC

## 2017-07-27 NOTE — Progress Notes (Signed)
Patient ID: Matthew Ponce, male   DOB: Jul 27, 1955, 62 y.o.   MRN: [id number]     Matthew Ponce, is a 62 y.o. male  WGN:[id number]  HQI:[id number]  DOB - March 05, 1956  Subjective:  Chief Complaint and XLK:GMWN in mid back and mid chest.  Occasionally feels as though his heart is racing when he is eating or going to the bathroom. This has been occurring for several months.  Occurs intermittently.  Lasts several hours.  No increase in SOB.  No diaphoresis.  Pain is sharp.  No FH early cardiac events.  He has never been a smoker.    Also asking about follow-up form H.pylori testing positive with GI.  He took the prescribed regimen but is still having midepgastric pain and symptoms.    ROS:   Constitutional:  No f/c, No night sweats, No unexplained weight loss. EENT:  No vision changes, No blurry vision, No hearing changes. No mouth, throat, or ear problems.  Respiratory: No cough, No SOB Cardiac: No CP, no palpitations GI:  + abd pain, No N/V/D. GU: No Urinary s/sx Musculoskeletal: No joint pain Neuro: No headache, no dizziness, no motor weakness.  Skin: No rash Endocrine:  No polydipsia. No polyuria.  Psych: Denies SI/HI  No problems updated.  ALLERGIES: No Known Allergies  PAST MEDICAL HISTORY: Past Medical History:  Diagnosis Date  . Anxiety   . Asthma   . Depression   . HTN (hypertension)   . Sleep apnea     MEDICATIONS AT HOME: Prior to Admission medications   Medication Sig Start Date End Date Taking? Authorizing Provider  aspirin EC 81 MG tablet Take 1 tablet (81 mg total) by mouth daily. 07/27/17   Anders Simmonds, PA-C  Bismuth Subsalicylate (PEPTO-BISMOL) 262 MG TABS Take 2 tablets (524 mg total) by mouth 2 (two) times daily. 06/13/17   Hilarie Fredrickson, MD  bismuth-metronidazole-tetracycline [facility]) [phone] MG capsule Take 3 capsules by mouth 4 (four) times daily -  before meals and at bedtime. Patient not taking: Reported on 07/27/2017 06/09/17   Hilarie Fredrickson,  MD  doxycycline (VIBRAMYCIN) 50 MG capsule Take 2 capsules (100 mg total) by mouth 2 (two) times daily. Patient not taking: Reported on 07/27/2017 06/13/17   Hilarie Fredrickson, MD  metroNIDAZOLE (FLAGYL) 250 MG tablet Take 1 tablet (250 mg total) by mouth 2 (two) times daily. Patient not taking: Reported on 07/27/2017 06/13/17   Hilarie Fredrickson, MD  omeprazole (PRILOSEC) 40 MG capsule Take 1 capsule (40 mg total) by mouth daily. 06/07/17   Hilarie Fredrickson, MD  oseltamivir (TAMIFLU) 75 MG capsule Take 1 capsule (75 mg total) by mouth daily. For flu prophylaxis Patient not taking: Reported on 06/07/2017 05/25/17   Anders Simmonds, PA-C  triamcinolone cream (KENALOG) 0.1 % Apply 1 application topically 2 (two) times daily. To dry itchy skin 05/25/17   Anders Simmonds, PA-C     Objective:  EXAM:   Vitals:   07/27/17 1505  BP: 109/75  Pulse: 64  Resp: 16  Temp: (!) 97.3 F (36.3 C)  TempSrc: Oral  SpO2: 98%  Weight: 125 lb (56.7 kg)    General appearance : A&OX3. NAD. Non-toxic-appearing HEENT: Atraumatic and Normocephalic.  PERRLA. EOM intact.  TM clear B. Mouth-MMM, post pharynx WNL w/o erythema, No PND. Neck: supple, no JVD. No cervical lymphadenopathy. No thyromegaly Chest/Lungs:  Breathing-non-labored, Good air entry bilaterally, breath sounds normal without rales, rhonchi, or wheezing  CVS: S1 S2 regular, no  murmurs, gallops, rubs  Abdomen: Bowel sounds present, Non tender and not distended with no gaurding, rigidity or rebound. Extremities: Bilateral Lower Ext shows no edema, both legs are warm to touch with = pulse throughout R arm appearing much better-still with mild eczema on flexural surface.   Neurology:  CN II-XII grossly intact, Non focal.   Psych:  TP linear. J/I WNL. Normal speech. Appropriate eye contact and affect.  Skin:  No Rash  Data Review Lab Results  Component Value Date   HGBA1C 5.2 02/08/2017   HGBA1C 5.1 05/12/2016     Assessment & Plan   1. Chest pain,  unspecified type EKG with sinus bradycardia and essentially unchanged since 2016. No ST segment changes.   Symptoms may be related to epigastric issues.  Also poor hydration overall and poor nutrition.  Drink 60-80 ounces water daily.  Warnings reviewed.  Call 911 if worsens.   - Ambulatory referral to Cardiology  2. Midepigastric pain F/up gastroenterology  3. Eczema, unspecified type Continue triamcinilone prn   Patient have been counseled extensively about nutrition and exercise  Return in about 3 months (around 10/27/2017) for assign PCP.  The patient was given clear instructions to go to ER or return to medical center if symptoms don't improve, worsen or new problems develop. The patient verbalized understanding. The patient was told to call to get lab results if they haven't heard anything in the next week.     Georgian CoAngela Joshwa Hemric, PA-C [facility] [place], [place] [phone]   07/27/2017, 3:38 PM

## 2017-07-27 NOTE — Patient Instructions (Addendum)
  Dr Yancey FlemingsJohn Perry Address: [address], [place], [place] [address]  Phone: [phone]

## 2017-08-26 ENCOUNTER — Encounter: Payer: Self-pay | Admitting: Physician Assistant

## 2017-08-26 ENCOUNTER — Ambulatory Visit (INDEPENDENT_AMBULATORY_CARE_PROVIDER_SITE_OTHER): Payer: Self-pay | Admitting: Physician Assistant

## 2017-08-26 VITALS — BP 110/72 | Ht 63.0 in | Wt 124.4 lb

## 2017-08-26 DIAGNOSIS — R002 Palpitations: Secondary | ICD-10-CM

## 2017-08-26 DIAGNOSIS — R072 Precordial pain: Secondary | ICD-10-CM

## 2017-08-26 DIAGNOSIS — R7989 Other specified abnormal findings of blood chemistry: Secondary | ICD-10-CM

## 2017-08-26 DIAGNOSIS — R001 Bradycardia, unspecified: Secondary | ICD-10-CM

## 2017-08-26 DIAGNOSIS — A048 Other specified bacterial intestinal infections: Secondary | ICD-10-CM

## 2017-08-26 DIAGNOSIS — I10 Essential (primary) hypertension: Secondary | ICD-10-CM

## 2017-08-26 LAB — T4, FREE: Free T4: 1.1 ng/dL (ref 0.82–1.77)

## 2017-08-26 LAB — TSH: TSH: 7.58 u[IU]/mL — ABNORMAL HIGH (ref 0.450–4.500)

## 2017-08-26 MED ORDER — ASPIRIN EC 81 MG PO TBEC: 81.0000 mg | DELAYED_RELEASE_TABLET | Freq: Every day | ORAL | 3 refills | Status: DC

## 2017-08-26 NOTE — Patient Instructions (Addendum)
Medication Instructions: Azalee CourseHao Meng, PA-C has recommended making the following medication changes: 1. START Aspirin 81 mg - take 1 tablet daily  Labwork: Your physician recommends that you return for lab work TODAY.  Testing/Procedures: 1. Exercise Tolerance Test - Your physician has requested that you have an exercise tolerance test. For further information please visit [email or web address] Please also follow instruction sheet, as given.   2. 30-day Cardiac Event Monitor - Your physician has recommended that you wear an event monitor. Event monitors are medical devices that record the heart's electrical activity. Doctors most often us these monitors to diagnose arrhythmias. Arrhythmias are problems with the speed or rhythm of the heartbeat. The monitor is a small, portable device. You can wear one while you do your normal daily activities. This is usually used to diagnose what is causing palpitations/syncope (passing out).  Follow-up: Wynema BirchHao recommends that you schedule a follow-up appointment in 3 months with Dr Rennis GoldenHilty.  If you need a refill on your cardiac medications before your next appointment, please call your pharmacy.

## 2017-08-26 NOTE — Progress Notes (Signed)
Cardiology Office Note    Date:  08/28/2017   ID:  Matthew Ponce, DOB 12-06-1955, MRN [id number]  PCP:  Lizbeth BarkHairston, Mandesia R, FNP  Cardiologist:  New - Case discussed with DOD Dr. Rennis GoldenHilty  Chief Complaint  Patient presents with  . New Patient (Initial Visit)    referred by Georgian CoAngela McClung PA-C, chest pain  x1-2 months, previously stroke (did not see doctor), pt complains of SOB, some swelling in legs    History of Present Illness:  Matthew Manekram Sloane is a 62 y.o. male with PMH of HTN, OSA and H pylori.  He was tested positive for H. pylori on biopsy 06/07/2017 following upper endoscopy.  Colonoscopy only showed mild internal hemorrhoid.  He was evaluated on 07/27/2017 for chest pain, and was subsequently referred to cardiology service for further evaluation.  According to the patient, he likely had a stroke 3 to 4 years ago.  He lost control of one side of his face, he also had a slurring of speech, symptoms eventually went away and he never sought medical attention for this.  He does not have any family history of CAD however both maternal and paternal grandmother have high blood pressure and diabetes.  His father died of car accident at age 60 and the his mother passed away for unknown reason at age 60.  TSH obtained in October 2018 was elevated at 8.0, although TSH was normal in January 2018.  He also had hemoglobin A1c obtained in both January and 04/2017, last hemoglobin A1c was 5.2.   For the past 2 to 3 months, he has been having both palpitation and also chest pain.  He was seen by his PCP Georgian CoAngela McClung in March 27th 2019 for chest pain and was referred to cardiology service for evaluation. Chest pain can occurs with food, at rest, and with physical exertion.  He says it occurs more so with physical exertion.  He also have a separate palpitations well, he feels like his heart rate will suddenly start racing.  He does not have any lower extremity edema, orthopnea or PND.  For his chest pain, I  discussed with DOD Dr. Rennis GoldenHilty regarding coronary CT versus other modalities, and we eventually recommended plain old treadmill test as initial screening test.  EKG today does show slightly more T wave inversion in inferior leads.  As far as his palpitation, I would recommend a 30-day event monitor.  I will follow-up on the abnormal thyroid with TSH and free T4.  He can follow-up with Dr. Rennis GoldenHilty for reassessment in 3 months, earlier if the tests come back abnormal.  I am unable to add a beta-blocker for suppression of palpitation because his baseline heart rate is 54.    Past Medical History:  Diagnosis Date  . Anxiety   . Asthma   . Depression   . HTN (hypertension)   . Sleep apnea     Past Surgical History:  Procedure Laterality Date  . REFRACTIVE SURGERY Bilateral     Current Medications: Outpatient Medications Prior to Visit  Medication Sig Dispense Refill  . Bismuth Subsalicylate (PEPTO-BISMOL) 262 MG TABS Take 2 tablets (524 mg total) by mouth 2 (two) times daily. (Patient not taking: Reported on 08/26/2017) 40 each 0  . bismuth-metronidazole-tetracycline (PYLERA) 140-125-125 MG capsule Take 3 capsules by mouth 4 (four) times daily -  before meals and at bedtime. (Patient not taking: Reported on 07/27/2017) 168 capsule 0  . doxycycline (VIBRAMYCIN) 50 MG capsule Take 2 capsules (100 mg  total) by mouth 2 (two) times daily. (Patient not taking: Reported on 07/27/2017) 40 capsule 0  . metroNIDAZOLE (FLAGYL) 250 MG tablet Take 1 tablet (250 mg total) by mouth 2 (two) times daily. (Patient not taking: Reported on 07/27/2017) 20 tablet 0  . omeprazole (PRILOSEC) 40 MG capsule Take 1 capsule (40 mg total) by mouth daily. (Patient not taking: Reported on 08/26/2017) 30 capsule 6  . oseltamivir (TAMIFLU) 75 MG capsule Take 1 capsule (75 mg total) by mouth daily. For flu prophylaxis (Patient not taking: Reported on 06/07/2017) 10 capsule 0  . triamcinolone cream (KENALOG) 0.1 % Apply 1 application  topically 2 (two) times daily. To dry itchy skin (Patient not taking: Reported on 08/26/2017) 80 g 2  . aspirin EC 81 MG tablet Take 1 tablet (81 mg total) by mouth daily. (Patient not taking: Reported on 08/26/2017) 100 tablet 1   Facility-Administered Medications Prior to Visit  Medication Dose Route Frequency Provider Last Rate Last Dose  . 0.9 %  sodium chloride infusion  500 mL Intravenous Once Hilarie Fredrickson, MD         Allergies:   Patient has no known allergies.   Social History   Socioeconomic History  . Marital status: Single    Spouse name: Not on file  . Number of children: 0  . Years of education: Not on file  . Highest education level: Not on file  Occupational History  . Not on file  Social Needs  . Financial resource strain: Not on file  . Food insecurity:    Worry: Not on file    Inability: Not on file  . Transportation needs:    Medical: Not on file    Non-medical: Not on file  Tobacco Use  . Smoking status: Never Smoker  . Smokeless tobacco: Never Used  Substance and Sexual Activity  . Alcohol use: No  . Drug use: No  . Sexual activity: Not on file  Lifestyle  . Physical activity:    Days per week: Not on file    Minutes per session: Not on file  . Stress: Not on file  Relationships  . Social connections:    Talks on phone: Not on file    Gets together: Not on file    Attends religious service: Not on file    Active member of club or organization: Not on file    Attends meetings of clubs or organizations: Not on file    Relationship status: Not on file  Other Topics Concern  . Not on file  Social History Narrative  . Not on file     Family History:  The patient's family history includes Diabetes Mellitus II in his maternal grandmother and paternal grandmother; Hypertension in his maternal grandmother and paternal grandmother.   ROS:   Please see the history of present illness.    ROS All other systems reviewed and are negative.   PHYSICAL  EXAM:   VS:  BP 110/72 (BP Location: Right Arm)   Ht 5\' 3"  (1.6 m)   Wt 124 lb 6.4 oz (56.4 kg)   BMI 22.04 kg/m    GEN: Well nourished, well developed, in no acute distress  HEENT: normal  Neck: no JVD, carotid bruits, or masses Cardiac: RRR; no murmurs, rubs, or gallops,no edema  Respiratory:  clear to auscultation bilaterally, normal work of breathing GI: soft, nontender, nondistended, + BS MS: no deformity or atrophy  Skin: warm and dry, no rash Neuro:  Alert  and Oriented x 3, Strength and sensation are intact Psych: euthymic mood, full affect  Wt Readings from Last 3 Encounters:  08/26/17 124 lb 6.4 oz (56.4 kg)  07/27/17 125 lb (56.7 kg)  06/07/17 130 lb (59 kg)      Studies/Labs Reviewed:   EKG:  EKG is ordered today.  The ekg ordered today demonstrates sinus bradycardia, heart rate 54  Recent Labs: 02/08/2017: ALT 9; BUN 26; Creatinine, Ser 0.95; Hemoglobin 13.7; Platelets 210; Potassium 3.7; Sodium 142 08/26/2017: TSH 7.580   Lipid Panel    Component Value Date/Time   CHOL 201 (H) 02/08/2017 1155   TRIG 134 02/08/2017 1155   HDL 65 02/08/2017 1155   CHOLHDL 3.1 02/08/2017 1155   LDLCALC 109 (H) 02/08/2017 1155    Additional studies/ records that were reviewed today include:   N/A   ASSESSMENT:    1. Precordial chest pain   2. Elevated TSH   3. Heart palpitations   4. Bradycardia   5. Essential hypertension   6. H. pylori infection      PLAN:  In order of problems listed above:  1. Precordial chest pain: Occurs with food, rest and with exertion.  I initially recommended a coronary CT, however the financial burden likely is too much.  I discussed the case with DOD Dr. Rennis Golden, we eventually recommended plain old treadmill test  2. Palpitation: I recommend a 30-day event monitor, patient says sometimes his heart rate will suddenly increase.  This occurs at a separate time as precordial chest pain.  3. Elevated TSH: Previous abnormal TSH in October  2018, will repeat TSH and a free T4  4. H. pylori infection: We will defer to primary care provider  5. Baseline bradycardia: Unable to add beta-blocker for suppression of palpitation at this time.    Medication Adjustments/Labs and Tests Ordered: Current medicines are reviewed at length with the patient today.  Concerns regarding medicines are outlined above.  Medication changes, Labs and Tests ordered today are listed in the Patient Instructions below. Patient Instructions  Medication Instructions: Azalee Course, PA-C has recommended making the following medication changes: 1. START Aspirin 81 mg - take 1 tablet daily  Labwork: Your physician recommends that you return for lab work TODAY.  Testing/Procedures: 1. Exercise Tolerance Test - Your physician has requested that you have an exercise tolerance test. For further information please visit [email or web address] Please also follow instruction sheet, as given.   2. 30-day Cardiac Event Monitor - Your physician has recommended that you wear an event monitor. Event monitors are medical devices that record the heart's electrical activity. Doctors most often [place] these monitors to diagnose arrhythmias. Arrhythmias are problems with the speed or rhythm of the heartbeat. The monitor is a small, portable device. You can wear one while you do your normal daily activities. This is usually used to diagnose what is causing palpitations/syncope (passing out).  Follow-up: Wynema Birch recommends that you schedule a follow-up appointment in 3 months with Dr Rennis Golden.  If you need a refill on your cardiac medications before your next appointment, please call your pharmacy.    Ramond Dial, [place]  08/28/2017 10:07 AM    [facility] [address] [place], [place], [place]  [address] Phone: [phone]; Fax: [phone]

## 2017-08-28 ENCOUNTER — Encounter: Payer: Self-pay | Admitting: Physician Assistant

## 2017-08-30 ENCOUNTER — Other Ambulatory Visit (INDEPENDENT_AMBULATORY_CARE_PROVIDER_SITE_OTHER): Payer: No Typology Code available for payment source

## 2017-08-30 ENCOUNTER — Ambulatory Visit (HOSPITAL_COMMUNITY)
Admission: RE | Admit: 2017-08-30 | Discharge: 2017-08-30 | Disposition: A | Discharge status: Home / Self Care | Payer: No Typology Code available for payment source | Source: Ambulatory Visit | Attending: Internal Medicine | Admitting: Internal Medicine

## 2017-08-30 ENCOUNTER — Encounter (HOSPITAL_COMMUNITY): Payer: Self-pay | Admitting: *Deleted

## 2017-08-30 DIAGNOSIS — R072 Precordial pain: Secondary | ICD-10-CM | POA: Insufficient documentation

## 2017-08-30 DIAGNOSIS — I10 Essential (primary) hypertension: Secondary | ICD-10-CM

## 2017-08-30 LAB — EXERCISE TOLERANCE TEST
CSEPED: 7 min
CSEPEW: 8.5 METS
CSEPPHR: 148 {beats}/min
Exercise duration (sec): 0 s
MPHR: 161 {beats}/min
Percent HR: 91 %
RPE: 18
Rest HR: 59 {beats}/min

## 2017-08-30 NOTE — Progress Notes (Unsigned)
Abnormal ETT was reviewed by Dr. Hilty. Patient was given the ok to be discharged to go home. 

## 2017-08-31 ENCOUNTER — Telehealth: Payer: Self-pay

## 2017-08-31 NOTE — Telephone Encounter (Signed)
Spoke with pt's friend Nickabu who states Pt is homeless and he mange all his personal matters. Advised that I couldn't release any medical information and to update DPR at next visit. Also advised to have pt call our office a soon as he can. Verbalized understanding.

## 2017-09-01 ENCOUNTER — Telehealth: Payer: Self-pay | Admitting: Internal Medicine

## 2017-09-01 ENCOUNTER — Other Ambulatory Visit: Payer: Self-pay

## 2017-09-01 MED ORDER — NITROGLYCERIN 0.4 MG SL SUBL: 0.4000 mg | SUBLINGUAL_TABLET | SUBLINGUAL | 3 refills | Status: DC | PRN

## 2017-09-01 MED FILL — NITROSTAT 0.4 MG TABLET SL: 0.4 | 25 days supply | Qty: 25 | Fill #0

## 2017-09-01 NOTE — Telephone Encounter (Signed)
Gave patient [employer] hardship application and instructed him to deliver it to the [employer] with all documents requested.

## 2017-09-02 NOTE — Telephone Encounter (Signed)
Notes recorded by Lucita Ferrara, CMA on 08/31/2017 at 5:14 PM EDT Spoke with patients friend, gave appointment info; he voiced understanding and stated he would get the patient here. ------  Notes recorded by Azalee Course, PA on 08/31/2017 at 4:52 PM EDT He will need the earliest available appt slot to discuss potential options. Abnormal stress test. I called his home phone number, apparently the patient is home, that home phone number belong to his friend who manage his appt schedule. Also give him sublingual nitro Rx  ** patient has MD OV on 09/08/17 to review abnormal stress test

## 2017-09-06 ENCOUNTER — Ambulatory Visit: Payer: Self-pay | Attending: Family Medicine

## 2017-09-08 ENCOUNTER — Encounter: Payer: Self-pay | Admitting: Internal Medicine

## 2017-09-08 ENCOUNTER — Ambulatory Visit (INDEPENDENT_AMBULATORY_CARE_PROVIDER_SITE_OTHER): Payer: No Typology Code available for payment source | Admitting: Internal Medicine

## 2017-09-08 VITALS — BP 110/78 | HR 63 | Ht 63.0 in | Wt 123.2 lb

## 2017-09-08 DIAGNOSIS — R9439 Abnormal result of other cardiovascular function study: Secondary | ICD-10-CM

## 2017-09-08 DIAGNOSIS — I10 Essential (primary) hypertension: Secondary | ICD-10-CM

## 2017-09-08 DIAGNOSIS — R0789 Other chest pain: Secondary | ICD-10-CM

## 2017-09-08 NOTE — Patient Instructions (Signed)
Medication Instructions:   START aspirin  once daily  Labwork:  NONE  Testing/Procedures:  NONE  Follow-Up:  Your physician recommends that you schedule a follow-up appointment in 6 months with Wynema Birch, [place]   If you need a refill on your cardiac medications before your next appointment, please call your pharmacy.  Any Other Special Instructions Will Be Listed Below (If Applicable).

## 2017-09-09 ENCOUNTER — Encounter: Payer: Self-pay | Admitting: Internal Medicine

## 2017-09-09 DIAGNOSIS — R0789 Other chest pain: Secondary | ICD-10-CM | POA: Insufficient documentation

## 2017-09-09 DIAGNOSIS — R9439 Abnormal result of other cardiovascular function study: Secondary | ICD-10-CM | POA: Insufficient documentation

## 2017-09-09 NOTE — Progress Notes (Signed)
OFFICE NOTE  Chief Complaint:  Follow-up stress test  Primary Care Physician: Patient, No Pcp Per  HPI:  Matthew Ponce is a 62 y.o. male with a past medial history significant for atypical chest pain which is described as a burning sensation, worse after eating or laying down.  Not necessarily worse with exertion or relieved by rest.  He underwent treadmill exercise stress testing which demonstrated 1 mm inferior and lateral ST depression at peak exercise at improved at rest.  He denied any anginal symptoms with exercise.  He was able to exercise for 7 minutes, with max predicted heart rate 91% and 8.5 metabolic equivalent workload.  He reports no recurrent significant discomfort in the chest.  These episodes lasted for only a few seconds at a time and sound very atypical for angina.  On top of this, he is homeless.  He is living in a mosque/shelter.  He is fasting for Ramadan.  He had numerous questions about dietary recommendations which we provided today.  Overall though, despite his stress test being mildly abnormal, I feel that his symptoms are not likely anginal.  I would recommend medical therapy.  PMHx:  Past Medical History:  Diagnosis Date  . Anxiety   . Asthma   . Depression   . HTN (hypertension)   . Sleep apnea     Past Surgical History:  Procedure Laterality Date  . REFRACTIVE SURGERY Bilateral     FAMHx:  Family History  Problem Relation Age of Onset  . Hypertension Maternal Grandmother   . Diabetes Mellitus II Maternal Grandmother   . Hypertension Paternal Grandmother   . Diabetes Mellitus II Paternal Grandmother     SOCHx:   reports that he has never smoked. He has never used smokeless tobacco. He reports that he does not drink alcohol or use drugs.  ALLERGIES:  No Known Allergies  ROS: Pertinent items noted in HPI and remainder of comprehensive ROS otherwise negative.  HOME MEDS: Current Outpatient Medications on File Prior to Visit  Medication Sig  Dispense Refill  . aspirin EC 81 MG tablet Take 81 mg by mouth daily.     Current Facility-Administered Medications on File Prior to Visit  Medication Dose Route Frequency Provider Last Rate Last Dose  . 0.9 %  sodium chloride infusion  500 mL Intravenous Once Hilarie Fredrickson, MD        LABS/IMAGING: No results found for this or any previous visit (from the past 48 hour(s)). No results found.  LIPID PANEL:    Component Value Date/Time   CHOL 201 (H) 02/08/2017 1155   TRIG 134 02/08/2017 1155   HDL 65 02/08/2017 1155   CHOLHDL 3.1 02/08/2017 1155   LDLCALC 109 (H) 02/08/2017 1155     WEIGHTS: Wt Readings from Last 3 Encounters:  09/08/17 123 lb 3.2 oz (55.9 kg)  08/26/17 124 lb 6.4 oz (56.4 kg)  07/27/17 125 lb (56.7 kg)    VITALS: BP 110/78 Comment: right  Pulse 63   Ht  (1.6 m)   Wt 123 lb 3.2 oz (55.9 kg)   BMI 21.82 kg/m   EXAM: Deferred  EKG: Deferred  ASSESSMENT: 1. Atypical chest pain 2. Mildly abnormal exercise treadmill stress test 3. Possible palpitations  PLAN: 1.   Mr. Fontaine is describing atypical chest pain.  He had a mildly abnormal stress test, but his symptoms were very brief and have not been an issue recently.  I would not pursue further testing at this  point based on his atypical symptom profile and the fact that he has no financial resources and is homeless.  He is working on trying to establish some type of insurance coverage.  We could consider medical therapy for cardiovascular disease and I would recommend starting low-dose aspirin.  His LDL was 109 in October although he is lost more weight and is currently fasting.  I suspect his cholesterol numbers would be lower.  His goal LDL should be around 100 or less.  We will plan follow-up with Azalee Course, PA-C.  Chrystie Nose, MD, [facility], FACP  [facility]  Medical Director of the [facility]  Card[facility] Reduction [facility] Diplomate of the American  Board of Clinical Lipidology Attending Cardiologist  Direct Dial: [phone]  Fax: [phone]  Website:  [email or web address] Nicely Edris Schneck 09/09/2017, 10:44 AM

## 2017-11-21 ENCOUNTER — Ambulatory Visit: Payer: No Typology Code available for payment source

## 2017-12-05 ENCOUNTER — Ambulatory Visit: Payer: Self-pay | Attending: Family Medicine

## 2017-12-07 ENCOUNTER — Ambulatory Visit: Payer: Self-pay | Admitting: Internal Medicine

## 2017-12-23 ENCOUNTER — Ambulatory Visit: Payer: No Typology Code available for payment source | Admitting: Internal Medicine

## 2017-12-26 ENCOUNTER — Ambulatory Visit: Payer: Self-pay | Attending: Family Medicine

## 2017-12-27 ENCOUNTER — Ambulatory Visit (INDEPENDENT_AMBULATORY_CARE_PROVIDER_SITE_OTHER): Payer: Self-pay | Admitting: Physician Assistant

## 2017-12-27 ENCOUNTER — Encounter: Payer: Self-pay | Admitting: Physician Assistant

## 2017-12-27 VITALS — BP 124/80 | HR 49 | Ht 66.0 in | Wt 124.0 lb

## 2017-12-27 DIAGNOSIS — G4733 Obstructive sleep apnea (adult) (pediatric): Secondary | ICD-10-CM

## 2017-12-27 DIAGNOSIS — R001 Bradycardia, unspecified: Secondary | ICD-10-CM

## 2017-12-27 DIAGNOSIS — E782 Mixed hyperlipidemia: Secondary | ICD-10-CM

## 2017-12-27 DIAGNOSIS — R9439 Abnormal result of other cardiovascular function study: Secondary | ICD-10-CM

## 2017-12-27 LAB — LIPID PANEL
Chol/HDL Ratio: 2.8 ratio (ref 0.0–5.0)
Cholesterol, Total: 204 mg/dL — ABNORMAL HIGH (ref 100–199)
HDL: 72 mg/dL (ref >39)
LDL Calculated: 121 mg/dL — ABNORMAL HIGH (ref 0–99)
Triglycerides: 55 mg/dL (ref 0–149)
VLDL Cholesterol Cal: 11 mg/dL (ref 5–40)

## 2017-12-27 MED ORDER — NITROGLYCERIN 0.4 MG SL SUBL: 0.4000 mg | SUBLINGUAL_TABLET | SUBLINGUAL | 3 refills | Status: AC | PRN

## 2017-12-27 MED FILL — NITROSTAT 0.4 MG TABLET SL: 0.4 | 25 days supply | Qty: 25 | Fill #1

## 2017-12-27 NOTE — Patient Instructions (Signed)
Medication Instructions:  START Nitrostat You will take this medication for emergency chest pain, Do not take on a daily basis ONLY as needed with a Max of 3 tabs a day.  Labwork: Your physician recommends that you return for lab work in: TODAY-LIPID  Testing/Procedures: NONE   Follow-Up: Your physician recommends that you schedule a follow-up appointment in: 3-4 MONTHS WITH DR HILTY.  Any Other Special Instructions Will Be Listed Below (If Applicable). If you need a refill on your cardiac medications before your next appointment, please call your pharmacy.

## 2017-12-27 NOTE — Progress Notes (Signed)
Cardiology Office Note    Date:  12/27/2017   ID:  Matthew Ponce, DOB [birth year], MRN [id number]  PCP:  Patient, No Pcp Per  Cardiologist:  Dr. Rennis Golden   Chief Complaint  Patient presents with  . Follow-up    pt mentions still having continued issues, denies chest pains, SOB, slight swelling noted in ankles.    History of Present Illness:  Matthew Ponce is a 62 y.o. male  with PMH of HTN, OSA and H pylori.  He was tested positive for H. pylori on biopsy 06/07/2017 following upper endoscopy.  Colonoscopy only showed mild internal hemorrhoid.  He was evaluated on 07/27/2017 for chest pain, and was subsequently referred to cardiology service for further evaluation.  According to the patient, he likely had a stroke 3 to 4 years ago.  He lost control of one side of his face, he also had a slurring of speech, symptoms eventually went away and he never sought medical attention for this.  He does not have any family history of CAD however both maternal and paternal grandmother have high blood pressure and diabetes.  His father died of car accident at age 58 and his mother passed away for unknown reason at age 70. He also had hemoglobin A1c obtained in both January and 04/2017, last hemoglobin A1c was 5.2.  I last saw the patient on 08/26/2017 for both palpitation and chest pain referred by his PCP.  I recommended a plain old treadmill test and a 30-day event monitor.  Plain old treadmill test obtained on 08/30/2017 showed 1 mm ST depression noted in the inferior leads.  He was able to exercise for 7 minutes and reach 91% of maximum predicted heart rate and 8.5 metabolic equivalents workload.  Patient was reevaluated by Dr. Rennis Golden, given the atypical nature of his symptom and the lack of exertional chest pain on the treadmill, medical therapy was recommended.  Note, patient is also homeless and lives in a mosque/daughter.  Lab work obtained recently showed elevated TSH but a normal free T4, suggesting subclinical  hypothyroidism  Patient presents today for cardiology office visit.  He is borderline bradycardic with heart rate of 49, however he denies any chest pain, dizziness or blurred vision.  Despite the recent abnormal stress test, he denies any exertional chest pain or shortness of breath.  He occasionally has a pain in between his shoulder blade, however this is very transient and quickly go away.  His last lipid panel was obtained in October 2018 which showed borderline elevated LDL and total cholesterol, I recommended repeat a fasting lipid panel.  If his cholesterol is still elevated, I would recommend a low-dose statin especially given the recent abnormal stress test.    Past Medical History:  Diagnosis Date  . Anxiety   . Asthma   . Depression   . HTN (hypertension)   . Sleep apnea     Past Surgical History:  Procedure Laterality Date  . REFRACTIVE SURGERY Bilateral     Current Medications: Outpatient Medications Prior to Visit  Medication Sig Dispense Refill  . aspirin EC 81 MG tablet Take 81 mg by mouth daily.     Facility-Administered Medications Prior to Visit  Medication Dose Route Frequency Provider Last Rate Last Dose  . 0.9 %  sodium chloride infusion  500 mL Intravenous Once Hilarie Fredrickson, MD         Allergies:   Patient has no known allergies.   Social History   Socioeconomic History  .  Marital status: Single    Spouse name: Not on file  . Number of children: 0  . Years of education: Not on file  . Highest education level: Not on file  Occupational History  . Not on file  Social Needs  . Financial resource strain: Not on file  . Food insecurity:    Worry: Not on file    Inability: Not on file  . Transportation needs:    Medical: Not on file    Non-medical: Not on file  Tobacco Use  . Smoking status: Never Smoker  . Smokeless tobacco: Never Used  Substance and Sexual Activity  . Alcohol use: No  . Drug use: No  . Sexual activity: Not on file    Lifestyle  . Physical activity:    Days per week: Not on file    Minutes per session: Not on file  . Stress: Not on file  Relationships  . Social connections:    Talks on phone: Not on file    Gets together: Not on file    Attends religious service: Not on file    Active member of club or organization: Not on file    Attends meetings of clubs or organizations: Not on file    Relationship status: Not on file  Other Topics Concern  . Not on file  Social History Narrative  . Not on file     Family History:  The patient's family history includes Diabetes Mellitus II in his maternal grandmother and paternal grandmother; Hypertension in his maternal grandmother and paternal grandmother.   ROS:   Please see the history of present illness.    ROS All other systems reviewed and are negative.   PHYSICAL EXAM:   VS:  BP 124/80 (BP Location: Left Arm, Patient Position: Sitting)   Pulse (!) 49   Ht 5\' 6"  (1.676 m)   Wt 124 lb (56.2 kg)   BMI 20.01 kg/m    GEN: Well nourished, well developed, in no acute distress  HEENT: normal  Neck: no JVD, carotid bruits, or masses Cardiac: RRR; no murmurs, rubs, or gallops,no edema  Respiratory:  clear to auscultation bilaterally, normal work of breathing GI: soft, nontender, nondistended, + BS MS: no deformity or atrophy  Skin: warm and dry, no rash Neuro:  Alert and Oriented x 3, Strength and sensation are intact Psych: euthymic mood, full affect  Wt Readings from Last 3 Encounters:  12/27/17 124 lb (56.2 kg)  09/08/17 123 lb 3.2 oz (55.9 kg)  08/26/17 124 lb 6.4 oz (56.4 kg)      Studies/Labs Reviewed:   EKG:  EKG is ordered today.  The ekg ordered today demonstrates sinus bradycardia, rate 49, no significant ST-T wave changes  Recent Labs: 02/08/2017: ALT 9; BUN 26; Creatinine, Ser 0.95; Hemoglobin 13.7; Platelets 210; Potassium 3.7; Sodium 142 08/26/2017: TSH 7.580   Lipid Panel    Component Value Date/Time   CHOL 201 (H)  02/08/2017 1155   TRIG 134 02/08/2017 1155   HDL 65 02/08/2017 1155   CHOLHDL 3.1 02/08/2017 1155   LDLCALC 109 (H) 02/08/2017 1155    Additional studies/ records that were reviewed today include:   ETT 08/30/2017 Study Highlights     Blood pressure demonstrated a normal response to exercise.  ST segment depression of 1 mm was noted during stress in the II, III, aVF and V6 leads.  T wave inversion was noted during stress in the II, III, aVF and V6 leads.  Positive ETT 1 mm horizontal ST depression in inferior lateral leads With associated T wave inversions in recovery        ASSESSMENT:    1. Abnormal cardiovascular stress test   2. Mixed hyperlipidemia   3. Bradycardia   4. OSA (obstructive sleep apnea)      PLAN:  In order of problems listed above:  1. Abnormal stress test: Continue to have very rare chest discomfort, does not occur with exertion.  I have given him sublingual nitroglycerin to take as needed.  He will need to continue aspirin 81 mg daily.  He has not been taking the aspirin recently.  He is aware that if he continued to have chest discomfort after the third nitroglycerin, he will need to seek more urgent medical attention.  His homeless status make compliance with dual antiplatelet therapy very questionable.  He would not be a good invasive study candidate unless there is clear evidence of coronary artery disease.  2. Bradycardia: Heart rate 49 this morning, not on any AV nodal blocking agent.  No clear sign of symptomatic bradycardia  3. Hyperlipidemia: Obtain fasting lipid panel, if cholesterol remains elevated, plan to add low-dose statin especially in light of abnormal stress test  4. Obstructive sleep apnea: Unfortunately his homeless status preclude him from having CPAP therapy even if he is tested positive.  Therefore I did not order a sleep study.    Medication Adjustments/Labs and Tests Ordered: Current medicines are reviewed at length  with the patient today.  Concerns regarding medicines are outlined above.  Medication changes, Labs and Tests ordered today are listed in the Patient Instructions below. Patient Instructions  Medication Instructions:  START Nitrostat You will take this medication for emergency chest pain, Do not take on a daily basis ONLY as needed with a Max of 3 tabs a day.  Labwork: Your physician recommends that you return for lab work in: TODAY-LIPID  Testing/Procedures: NONE   Follow-Up: Your physician recommends that you schedule a follow-up appointment in: 3-4 MONTHS WITH DR HILTY.  Any Other Special Instructions Will Be Listed Below (If Applicable). If you need a refill on your cardiac medications before your next appointment, please call your pharmacy.     Ramond Dial, [place]  12/27/2017 1:51 PM    [facility] [address] [place], [place], [place]  [address] Phone: [phone]; Fax: [phone]

## 2017-12-27 NOTE — Progress Notes (Signed)
Recommend add lipitor 40mg  daily, FLP and LFT in 2-3 month

## 2017-12-28 ENCOUNTER — Other Ambulatory Visit: Payer: Self-pay

## 2017-12-28 DIAGNOSIS — E782 Mixed hyperlipidemia: Secondary | ICD-10-CM

## 2017-12-28 MED ORDER — ATORVASTATIN CALCIUM 40 MG PO TABS: 40.0000 mg | ORAL_TABLET | Freq: Every day | ORAL | 3 refills | Status: DC

## 2017-12-28 MED FILL — ATORVASTATIN CALCIUM 40 MG: 40 | 30 days supply | Qty: 30 | Fill #0

## 2018-01-13 ENCOUNTER — Ambulatory Visit: Payer: Self-pay | Admitting: Family Medicine

## 2018-04-21 ENCOUNTER — Encounter: Payer: Self-pay | Admitting: Internal Medicine

## 2018-04-21 ENCOUNTER — Ambulatory Visit (INDEPENDENT_AMBULATORY_CARE_PROVIDER_SITE_OTHER): Payer: No Typology Code available for payment source | Admitting: Internal Medicine

## 2018-04-21 VITALS — BP 102/66 | HR 55 | Ht 66.0 in | Wt 126.0 lb

## 2018-04-21 DIAGNOSIS — E782 Mixed hyperlipidemia: Secondary | ICD-10-CM

## 2018-04-21 DIAGNOSIS — I10 Essential (primary) hypertension: Secondary | ICD-10-CM

## 2018-04-21 NOTE — Progress Notes (Signed)
OFFICE NOTE  Chief Complaint:  Follow-up  Primary Care Physician: Anders SimmondsMcClung, Angela M, PA-C  HPI:  Matthew Ponce is a 62 y.o. male with a past medial history significant for atypical chest pain which is described as a burning sensation, worse after eating or laying down.  Not necessarily worse with exertion or relieved by rest.  He underwent treadmill exercise stress testing which demonstrated 1 mm inferior and lateral ST depression at peak exercise at improved at rest.  He denied any anginal symptoms with exercise.  He was able to exercise for 7 minutes, with max predicted heart rate 91% and 8.5 metabolic equivalent workload.  He reports no recurrent significant discomfort in the chest.  These episodes lasted for only a few seconds at a time and sound very atypical for angina.  On top of this, he is homeless.  He is living in a mosque/shelter.  He is fasting for Ramadan.  He had numerous questions about dietary recommendations which we provided today.  Overall though, despite his stress test being mildly abnormal, I feel that his symptoms are not likely anginal.  I would recommend medical therapy.  04/21/2018  Mr. Matthew Ponce is seen today in follow-up.  He is accompanied by a friend.  He saw Azalee CourseHao Meng, PA-C in August.  The time blood work indicated elevated cholesterol.  It was recommended he start high potency atorvastatin 40 mg daily.  He says he has not been compliant with that medication.  He is also not compliant with aspirin.  He continues to speak about how he can make dietary changes possibly to help his symptoms.  He is complaining of more pain in his back rather than chest pain at this point.  He continues in a homeless status living and a mosque.  He has no insurance and is in the nonresident.  [facility]  PMHx:  Past Medical History:  Diagnosis Date  . Anxiety   . Asthma   . Depression   . HTN (hypertension)   . Sleep apnea     Past  Surgical History:  Procedure Laterality Date  . REFRACTIVE SURGERY Bilateral     FAMHx:  Family History  Problem Relation Age of Onset  . Hypertension Maternal Grandmother   . Diabetes Mellitus II Maternal Grandmother   . Hypertension Paternal Grandmother   . Diabetes Mellitus II Paternal Grandmother     SOCHx:   reports that he has never smoked. He has never used smokeless tobacco. He reports that he does not drink alcohol or use drugs.  ALLERGIES:  No Known Allergies  ROS: Pertinent items noted in HPI and remainder of comprehensive ROS otherwise negative.  HOME MEDS: Current Outpatient Medications on File Prior to Visit  Medication Sig Dispense Refill  . aspirin EC 81 MG tablet Take 81 mg by mouth daily.    Marland Kitchen. atorvastatin (LIPITOR) 40 MG tablet Take 1 tablet (40 mg total) by mouth daily. 90 tablet 3  . nitroGLYCERIN (NITROSTAT) 0.4 MG SL tablet Place 1 tablet (0.4 mg total) under the tongue every 5 (five) minutes as needed for chest pain. 90 tablet 3   Current Facility-Administered Medications on File Prior to Visit  Medication Dose Route Frequency Provider Last Rate Last Dose  . 0.9 %  sodium chloride infusion  500 mL Intravenous Once Hilarie FredricksonPerry, John N, MD        LABS/IMAGING: No results found for this or any previous visit (from the past 48  hour(s)). No results found.  LIPID PANEL:    Component Value Date/Time   CHOL 204 (H) 12/27/2017 0851   TRIG 55 12/27/2017 0851   HDL 72 12/27/2017 0851   CHOLHDL 2.8 12/27/2017 0851   LDLCALC 121 (H) 12/27/2017 0851     WEIGHTS: Wt Readings from Last 3 Encounters:  04/21/18 126 lb (57.2 kg)  12/27/17 124 lb (56.2 kg)  09/08/17 123 lb 3.2 oz (55.9 kg)    VITALS: BP 102/66   Pulse (!) 55   Ht 5\' 6"  (1.676 m)   Wt 126 lb (57.2 kg)   BMI 20.34 kg/m   EXAM: General appearance: alert and no distress Lungs: clear to auscultation bilaterally Heart: regular rate and rhythm, S1, S2 normal, no murmur, click, rub or  gallop Neurologic: Grossly normal  EKG: Minus bradycardia 55-personally reviewed  ASSESSMENT: 1. Atypical chest pain 2. Mildly abnormal exercise treadmill stress test 3. Possible palpitations 4. Treatment noncompliance  PLAN: 1.   Mr. Matthew Ponce is noncompliant with treatment recommendations.  I advised he start taking aspirin and a statin and would recommend 5570-month follow-up with a lipid profile.  If he does not have any significant changes in his lipid profile or evidence of noncompliance at that time we will likely dismiss him from the practice.  Chrystie NoseKenneth C. Vivienne Sangiovanni, MD, [facility] HealthFACC, FACP  [place]  [facility]  Medical Director of the Advanced Lipid Disorders &  Cardiovascular Risk Reduction Clinic Diplomate of the American Board of Clinical Lipidology Attending Cardiologist  Direct Dial: [phone]  Fax: [phone]  Website:  [email or web address]   Joyce Leckey C Ermin Parisien 04/21/2018, 9:27 AM

## 2018-04-21 NOTE — Patient Instructions (Signed)
Medication Instructions:  Continue current medications If you need a refill on your cardiac medications before your next appointment, please call your pharmacy.   Lab work: FASTING lab work in 6 months to check cholesterol If you have labs (blood work) drawn today and your tests are completely normal, you will receive your results only by: Marland Kitchen. MyChart Message (if you have MyChart) OR . A paper copy in the mail If you have any lab test that is abnormal or we need to change your treatment, we will call you to review the results.  Follow-Up: [facility], you and your health needs are our priority.  As part of our continuing mission to provide you with exceptional heart care, we have created designated Provider Care Teams.  These Care Teams include your primary Cardiologist (physician) and Advanced Practice Providers (APPs -  Physician Assistants and Nurse Practitioners) who all work together to provide you with the care you need, when you need it. You will need a follow up appointment in 6 months.  Please call our office 2 months in advance to schedule this appointment.  You may see Dr. Rennis GoldenHilty or one of the following Advanced Practice Providers on your designated Care Team: Azalee CourseHao Meng, [place]PA-C . Micah FlesherAngela Duke, PA-C  Any Other Special Instructions Will Be Listed Below (If Applicable).

## 2018-06-08 IMAGING — CR DG CHEST 2V
2 series · 2 of 2 positions shown · non-contrast
Comparison: None.

CLINICAL DATA: Chest pain, shortness of breath for 3 months

EXAM:
CHEST  2 VIEW

[w chest pa]
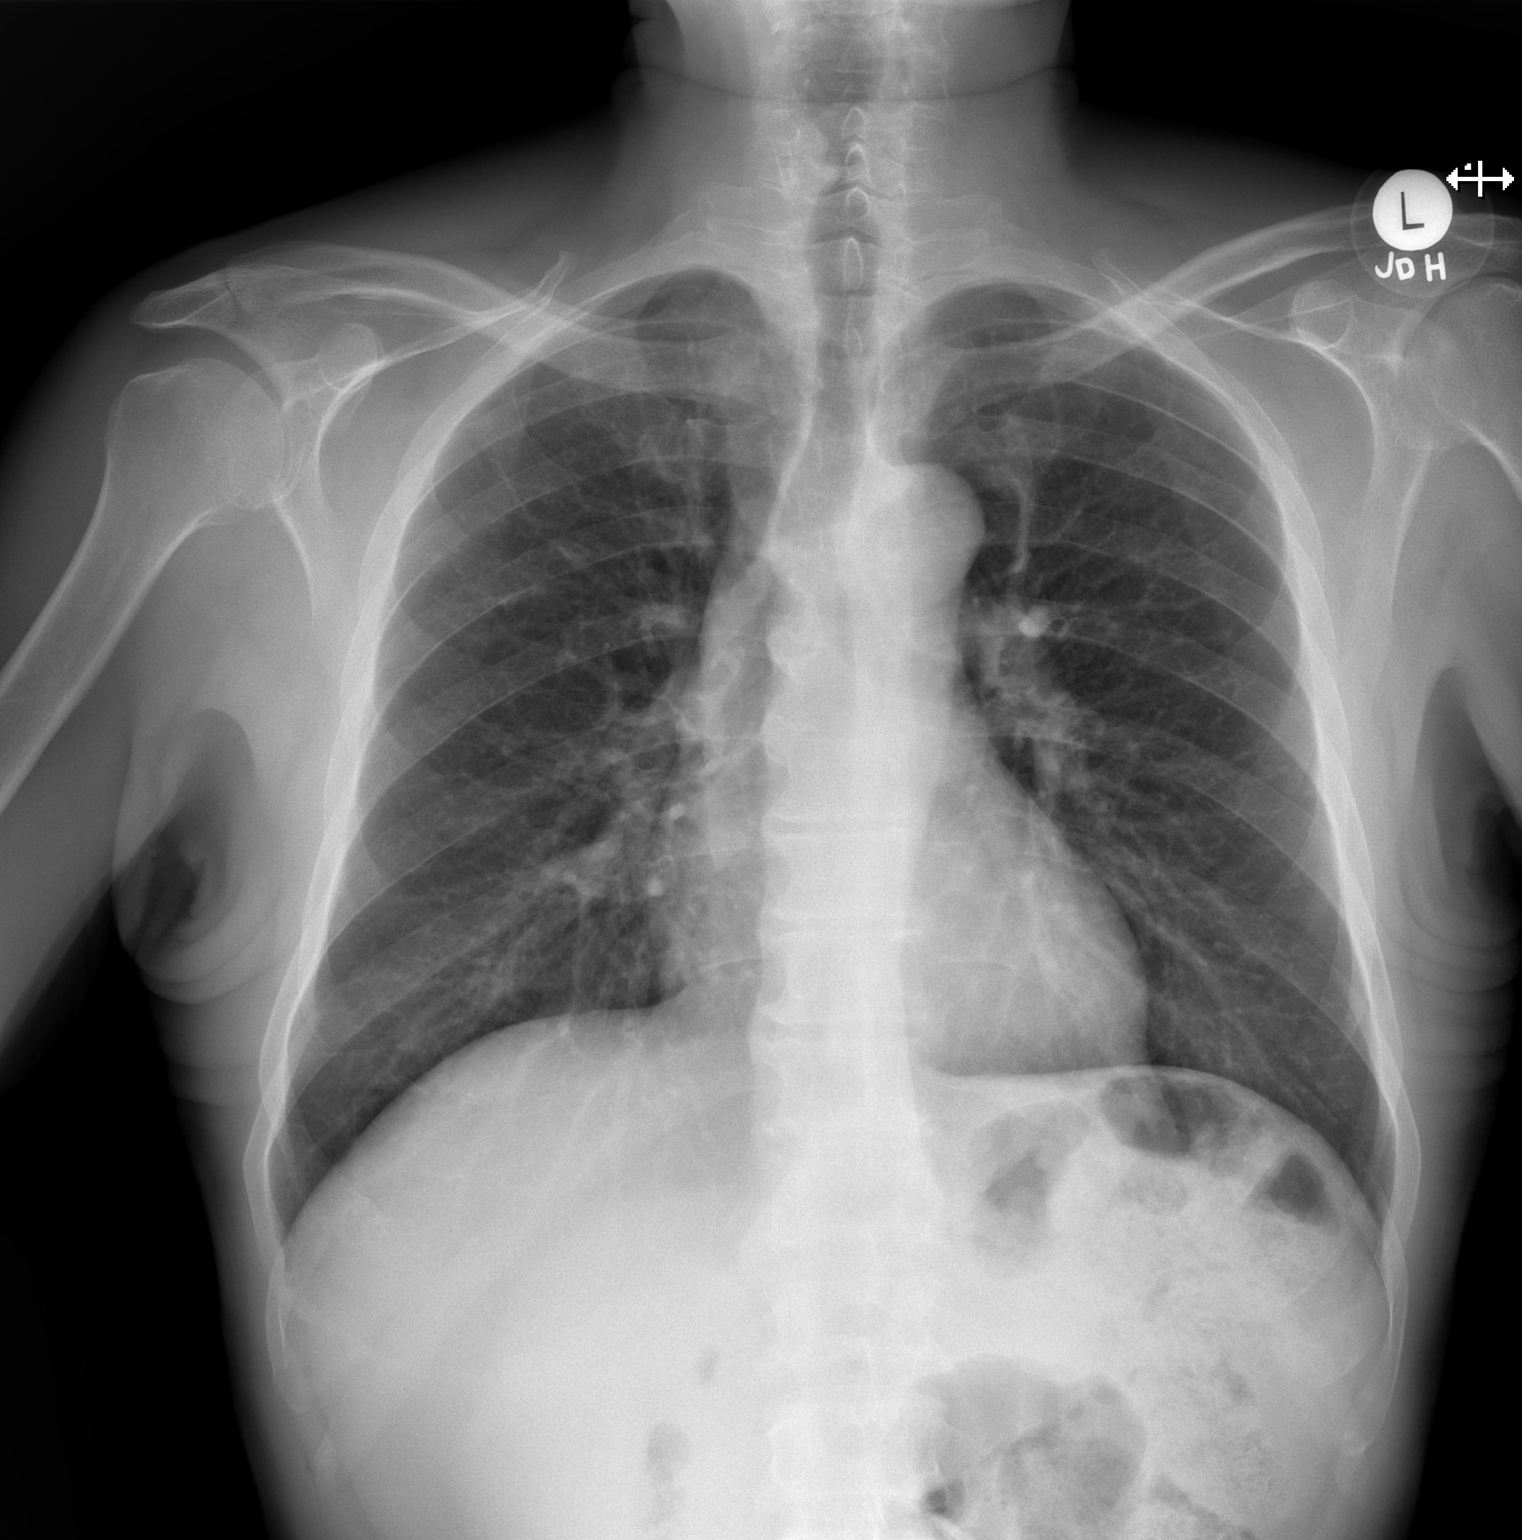

[w chest lat]
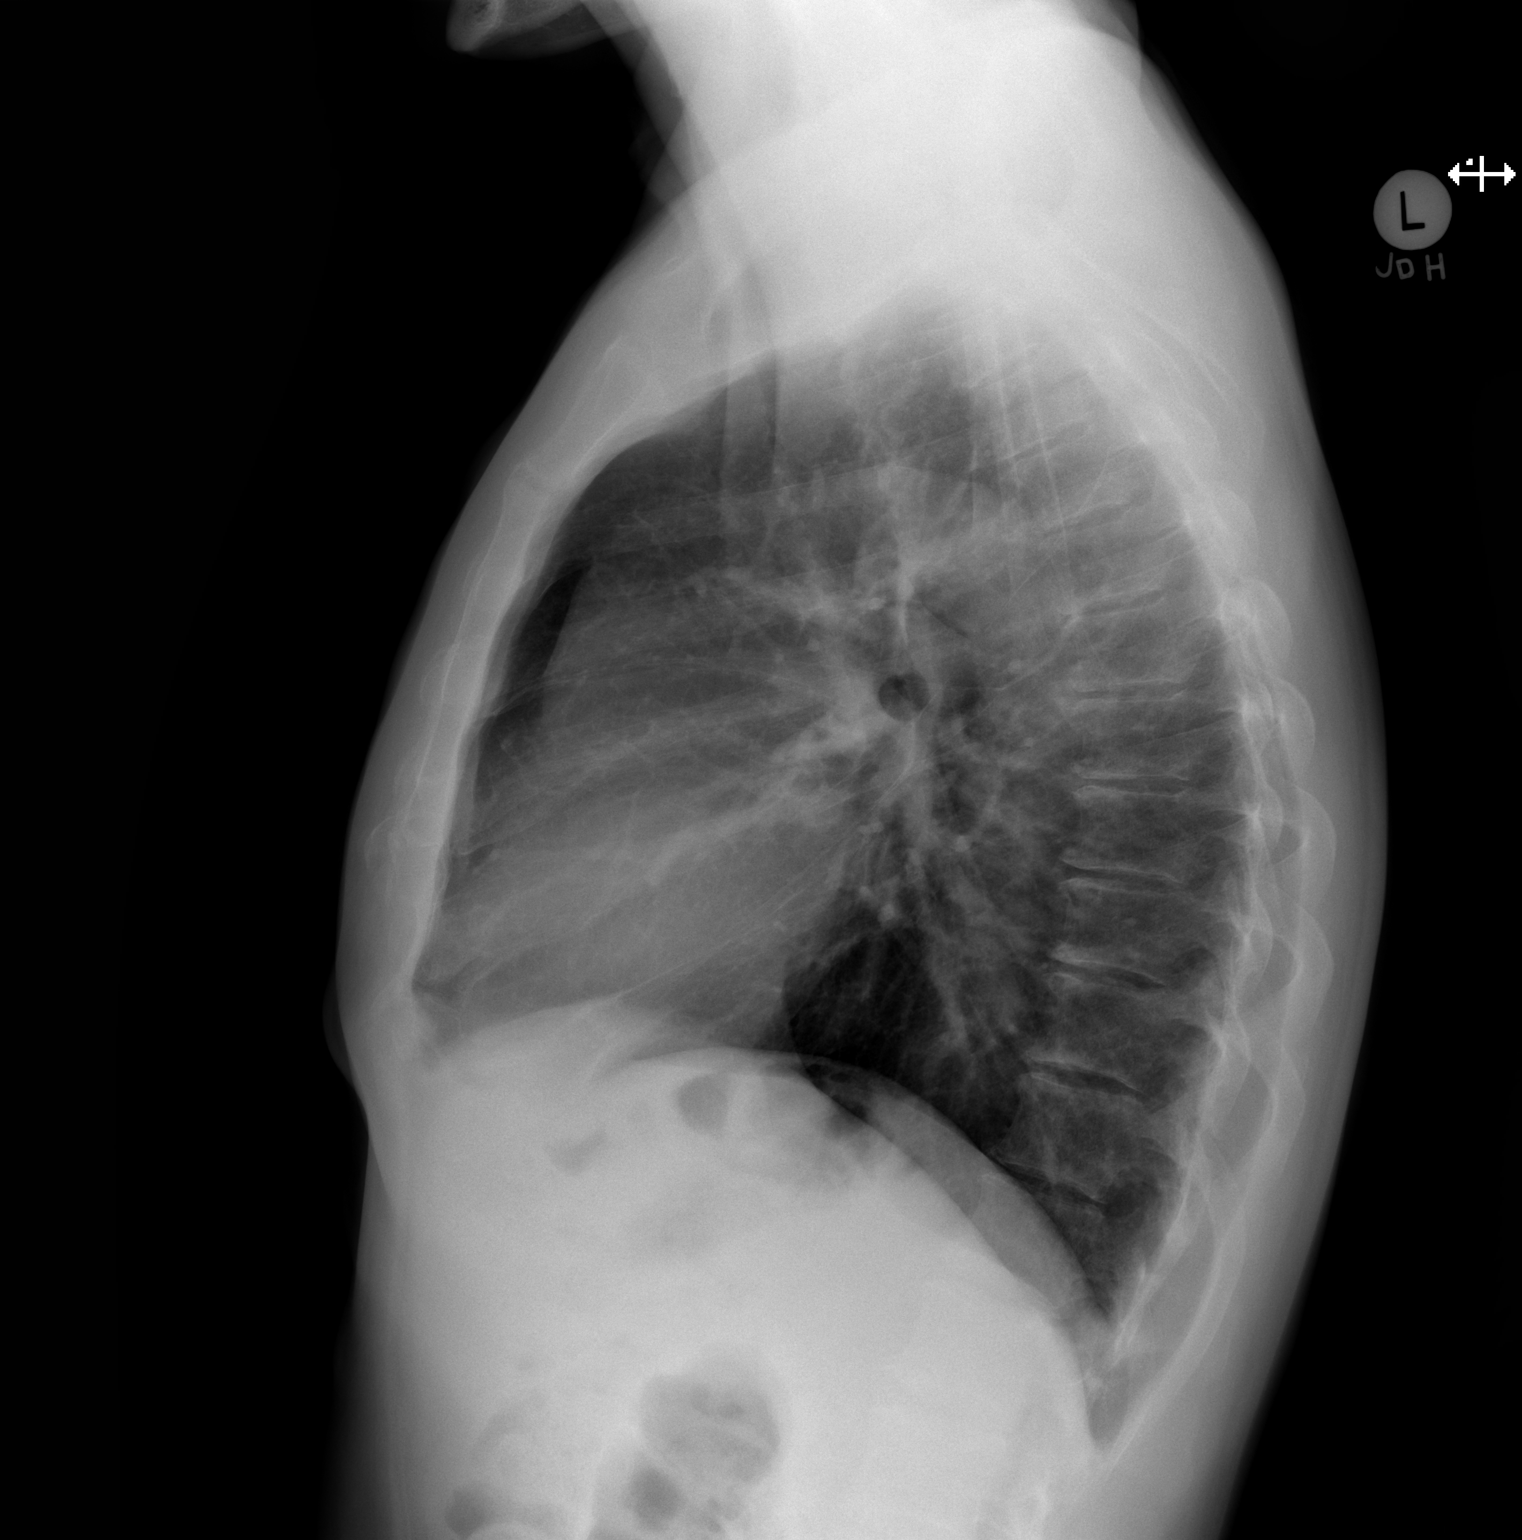

[2 of 2 positions shown; findings below may reference images not displayed]

FINDINGS: No active infiltrate or effusion is seen. Mediastinal and hilar
contours are unremarkable. The heart is within upper limits of
normal. There are mild degenerative changes in the mid to lower
thoracic spine.
IMPRESSION: No active cardiopulmonary disease.

## 2018-06-27 ENCOUNTER — Ambulatory Visit: Payer: No Typology Code available for payment source

## 2018-07-06 ENCOUNTER — Ambulatory Visit: Payer: Self-pay

## 2018-07-14 ENCOUNTER — Ambulatory Visit: Payer: Self-pay

## 2018-08-21 ENCOUNTER — Ambulatory Visit: Payer: Self-pay | Admitting: Family Medicine

## 2019-05-09 ENCOUNTER — Other Ambulatory Visit: Payer: Self-pay

## 2019-05-09 ENCOUNTER — Ambulatory Visit: Payer: Self-pay | Attending: Family Medicine | Admitting: Family Medicine

## 2019-05-09 ENCOUNTER — Encounter: Payer: Self-pay | Admitting: Family Medicine

## 2019-05-09 ENCOUNTER — Ambulatory Visit (HOSPITAL_BASED_OUTPATIENT_CLINIC_OR_DEPARTMENT_OTHER): Payer: Self-pay | Admitting: Pharmacist

## 2019-05-09 VITALS — BP 149/87 | HR 54 | Resp 16 | Ht 65.0 in | Wt 133.6 lb

## 2019-05-09 DIAGNOSIS — R030 Elevated blood-pressure reading, without diagnosis of hypertension: Secondary | ICD-10-CM

## 2019-05-09 DIAGNOSIS — Z23 Encounter for immunization: Secondary | ICD-10-CM

## 2019-05-09 DIAGNOSIS — R7989 Other specified abnormal findings of blood chemistry: Secondary | ICD-10-CM

## 2019-05-09 DIAGNOSIS — R1030 Lower abdominal pain, unspecified: Secondary | ICD-10-CM

## 2019-05-09 DIAGNOSIS — R350 Frequency of micturition: Secondary | ICD-10-CM

## 2019-05-09 DIAGNOSIS — Z1159 Encounter for screening for other viral diseases: Secondary | ICD-10-CM

## 2019-05-09 LAB — POCT GLYCOSYLATED HEMOGLOBIN (HGB A1C): HbA1c POC (<> result, manual entry): 5.2 %

## 2019-05-09 LAB — GLUCOSE, POCT (MANUAL RESULT ENTRY): POC Glucose: 61 mg/dL — AB (ref 70–99)

## 2019-05-09 NOTE — Patient Instructions (Signed)
Influenza Virus Vaccine injection (Fluarix) What is this medicine? INFLUENZA VIRUS VACCINE (in floo EN zuh VAHY ruhs vak SEEN) helps to reduce the risk of getting influenza also known as the flu. This medicine may be used for other purposes; ask your health care provider or pharmacist if you have questions. COMMON BRAND NAME(S): Fluarix, Fluzone What should I tell my health care provider before I take this medicine? They need to know if you have any of these conditions:  bleeding disorder like hemophilia  fever or infection  Guillain-Barre syndrome or other neurological problems  immune system problems  infection with the human immunodeficiency virus (HIV) or AIDS  low blood platelet counts  multiple sclerosis  an unusual or allergic reaction to influenza virus vaccine, eggs, chicken proteins, latex, gentamicin, other medicines, foods, dyes or preservatives  pregnant or trying to get pregnant  breast-feeding How should I use this medicine? This vaccine is for injection into a muscle. It is given by a health care professional. A copy of Vaccine Information Statements will be given before each vaccination. Read this sheet carefully each time. The sheet may change frequently. Talk to your pediatrician regarding the use of this medicine in children. Special care may be needed. Overdosage: If you think you have taken too much of this medicine contact a poison control center or emergency room at once. NOTE: This medicine is only for you. Do not share this medicine with others. What if I miss a dose? This does not apply. What may interact with this medicine?  chemotherapy or radiation therapy  medicines that lower your immune system like etanercept, anakinra, infliximab, and adalimumab  medicines that treat or prevent blood clots like warfarin  phenytoin  steroid medicines like prednisone or cortisone  theophylline  vaccines This list may not describe all possible  interactions. Give your health care provider a list of all the medicines, herbs, non-prescription drugs, or dietary supplements you use. Also tell them if you smoke, drink alcohol, or use illegal drugs. Some items may interact with your medicine. What should I watch for while using this medicine? Report any side effects that do not go away within 3 days to your doctor or health care professional. Call your health care provider if any unusual symptoms occur within 6 weeks of receiving this vaccine. You may still catch the flu, but the illness is not usually as bad. You cannot get the flu from the vaccine. The vaccine will not protect against colds or other illnesses that may cause fever. The vaccine is needed every year. What side effects may I notice from receiving this medicine? Side effects that you should report to your doctor or health care professional as soon as possible:  allergic reactions like skin rash, itching or hives, swelling of the face, lips, or tongue Side effects that usually do not require medical attention (report to your doctor or health care professional if they continue or are bothersome):  fever  headache  muscle aches and pains  pain, tenderness, redness, or swelling at site where injected  weak or tired This list may not describe all possible side effects. Call your doctor for medical advice about side effects. You may report side effects to FDA at 1-800-FDA-1088. Where should I keep my medicine? This vaccine is only given in a clinic, pharmacy, doctor's office, or other health care setting and will not be stored at home. NOTE: This sheet is a summary. It may not cover all possible information. If you have questions   about this medicine, talk to your doctor, pharmacist, or health care provider.  2020 Elsevier/Gold Standard (2007-11-15 09:30:40)  Td (Tetanus, Diphtheria) Vaccine: What You Need to Know 1. Why get vaccinated? Td vaccine can prevent tetanus and  diphtheria. Tetanus enters the body through cuts or wounds. Diphtheria spreads from person to person.  TETANUS (T) causes painful stiffening of the muscles. Tetanus can lead to serious health problems, including being unable to open the mouth, having trouble swallowing and breathing, or death.  DIPHTHERIA (D) can lead to difficulty breathing, heart failure, paralysis, or death. 2. Td vaccine Td is only for children 7 years and older, adolescents, and adults.  Td is usually given as a booster dose every 10 years, but it can also be given earlier after a severe and dirty wound or burn. Another vaccine, called Tdap, that protects against pertussis, also known as "whooping cough," in addition to tetanus and diphtheria, may be used instead of Td.  Td may be given at the same time as other vaccines. 3. Talk with your health care provider Tell your vaccine provider if the person getting the vaccine:  Has had an allergic reaction after a previous dose of any vaccine that protects against tetanus or diphtheria, or has any severe, life-threatening allergies.  Has ever had Guillain-Barr Syndrome (also called GBS).  Has had severe pain or swelling after a previous dose of any vaccine that protects against tetanus or diphtheria. In some cases, your health care provider may decide to postpone Td vaccination to a future visit.  People with minor illnesses, such as a cold, may be vaccinated. People who are moderately or severely ill should usually wait until they recover before getting Td vaccine.  Your health care provider can give you more information. 4. Risks of a vaccine reaction  Pain, redness, or swelling where the shot was given, mild fever, headache, feeling tired, and nausea, vomiting, diarrhea, or stomachache sometimes happen after Td vaccine. People sometimes faint after medical procedures, including vaccination. Tell your provider if you feel dizzy or have vision changes or ringing in the  ears.  As with any medicine, there is a very remote chance of a vaccine causing a severe allergic reaction, other serious injury, or death. 5. What if there is a serious problem? An allergic reaction could occur after the vaccinated person leaves the clinic. If you see signs of a severe allergic reaction (hives, swelling of the face and throat, difficulty breathing, a fast heartbeat, dizziness, or weakness), call 9-1-1 and get the person to the nearest hospital.  For other signs that concern you, call your health care provider.  Adverse reactions should be reported to the Vaccine Adverse Event Reporting System (VAERS). Your health care provider will usually file this report, or you can do it yourself. Visit the VAERS website at www.vaers.hhs.gov or call 1-800-822-7967. VAERS is only for reporting reactions, and VAERS staff do not give medical advice. 6. The National Vaccine Injury Compensation Program The National Vaccine Injury Compensation Program (VICP) is a federal program that was created to compensate people who may have been injured by certain vaccines. Visit the VICP website at www.hrsa.gov/vaccinecompensation or call 1-800-338-2382 to learn about the program and about filing a claim. There is a time limit to file a claim for compensation. 7. How can I learn more?  Ask your health care provider.  Call your local or state health department.  Contact the Centers for Disease Control and Prevention (CDC): ? Call 1-800-232-4636 (1-800-CDC-INFO) or ?   Visit CDC's website at www.cdc.gov/vaccines Vaccine Information Statement Td Vaccine (08/02/18) This information is not intended to replace advice given to you by your health care provider. Make sure you discuss any questions you have with your health care provider. Document Revised: 09/11/2018 Document Reviewed: 08/14/2018 Elsevier Patient Education  2020 Elsevier Inc.  

## 2019-05-09 NOTE — Progress Notes (Signed)
Pt states he is having some pain on his right side

## 2019-05-09 NOTE — Progress Notes (Signed)
Patient presents for vaccination against tetanus and influenza per orders of Dr. Fulp. Consent given. Counseling provided. No contraindications exists. Vaccine administered without incident.   

## 2019-05-09 NOTE — Progress Notes (Signed)
Established Patient Office Visit  Subjective:  Patient ID: Matthew Ponce, male    DOB: [birth year]  Age: 64 y.o. MRN: [id number]  CC:  Chief Complaint  Patient presents with  . Establish Care    HPI Matthew Ponce, 64 yo male new to the practice who was last seen here by another provider on 07/27/2017.  At his visit on 07/27/2017, he had complaints of chest pain/palpitations for which he was referred to cardiology as well as history of midepigastric pain with prior positive H. pylori test for which he was referred to gastroenterology for further follow-up.  He was also provided with refill of triamcinolone for continued treatment of eczema.          At today's visit, patient has complaint of lower abdominal discomfort along with some frequency of urination.  He denies any burning or pain with urination.  No concerns regarding STIs.  No penile discharge or sores in the genital area.  He also has had some right mid back discomfort.  Symptoms have been going on for over a week.  He denies any fever or chills.         He reports a history of hypertension for which he took medication in the past but after losing weight he no longer needed to be on blood pressure medication.  He denies any headaches or dizziness related to his blood pressure.  He reports no current reflux symptoms and is not on medication to reduce stomach acid.  He reports that he did follow-up with cardiology regarding the chest pain at his last office visit.  Chest pain has resolved.         On review of health maintenance and need for possible screening for hepatitis C, patient reports that he believes that he had a form of hepatitis in the past but he cannot recall exactly what kind of hepatitis.  He does not believe that he has any lasting side effects.  He denies any issues with right upper quadrant abdominal pain, no fever, no chills, no jaundice and no excessive fatigue.          Past Medical History:  Diagnosis Date  .  Anxiety   . Asthma   . Depression   . HTN (hypertension)   . Sleep apnea     Past Surgical History:  Procedure Laterality Date  . REFRACTIVE SURGERY Bilateral     Family History  Problem Relation Age of Onset  . Hypertension Maternal Grandmother   . Diabetes Mellitus II Maternal Grandmother   . Hypertension Paternal Grandmother   . Diabetes Mellitus II Paternal Grandmother     Social History   Socioeconomic History  . Marital status: Single    Spouse name: Not on file  . Number of children: 0  . Years of education: Not on file  . Highest education level: Not on file  Occupational History  . Not on file  Tobacco Use  . Smoking status: Never Smoker  . Smokeless tobacco: Never Used  Substance and Sexual Activity  . Alcohol use: No  . Drug use: No  . Sexual activity: Not on file  Other Topics Concern  . Not on file  Social History Narrative  . Not on file   Social Determinants of Health   Financial Resource Strain:   . Difficulty of Paying Living Expenses: Not on file  Food Insecurity:   . Worried About Charity fundraiser in the Last Year: Not on file  .  Ran Out of Food in the Last Year: Not on file  Transportation Needs:   . Lack of Transportation (Medical): Not on file  . Lack of Transportation (Non-Medical): Not on file  Physical Activity:   . Days of Exercise per Week: Not on file  . Minutes of Exercise per Session: Not on file  Stress:   . Feeling of Stress : Not on file  Social Connections:   . Frequency of Communication with Friends and Family: Not on file  . Frequency of Social Gatherings with Friends and Family: Not on file  . Attends Religious Services: Not on file  . Active Member of Clubs or Organizations: Not on file  . Attends Banker Meetings: Not on file  . Marital Status: Not on file  Intimate Partner Violence:   . Fear of Current or Ex-Partner: Not on file  . Emotionally Abused: Not on file  . Physically Abused: Not on  file  . Sexually Abused: Not on file    Outpatient Medications Prior to Visit  Medication Sig Dispense Refill  . aspirin EC 81 MG tablet Take 81 mg by mouth daily.    Marland Kitchen atorvastatin (LIPITOR) 40 MG tablet Take 1 tablet (40 mg total) by mouth daily. 90 tablet 3  . nitroGLYCERIN (NITROSTAT) 0.4 MG SL tablet Place 1 tablet (0.4 mg total) under the tongue every 5 (five) minutes as needed for chest pain. 90 tablet 3   Facility-Administered Medications Prior to Visit  Medication Dose Route Frequency Provider Last Rate Last Admin  . 0.9 %  sodium chloride infusion  500 mL Intravenous Once Hilarie Fredrickson, MD        No Known Allergies  ROS Review of Systems  Constitutional: Negative for chills and fever.  HENT: Negative for sore throat.   Respiratory: Negative for cough and shortness of breath.   Cardiovascular: Negative for chest pain and palpitations.  Gastrointestinal: Positive for abdominal pain (Mid lower abdominal discomfort). Negative for blood in stool, constipation, diarrhea and nausea.  Endocrine: Positive for polyuria. Negative for polydipsia and polyphagia.  Genitourinary: Positive for dysuria and frequency. Negative for discharge, hematuria and urgency.  Musculoskeletal: Positive for back pain (Recent, mild right mid back pain). Negative for arthralgias.  Neurological: Negative for dizziness and headaches.  Hematological: Negative for adenopathy. Does not bruise/bleed easily.      Objective:    Physical Exam  Constitutional: He is oriented to person, place, and time. He appears well-developed and well-nourished.  Neck: No JVD present. No thyromegaly present.  Cardiovascular: Normal rate and regular rhythm.  Pulmonary/Chest: Effort normal and breath sounds normal.  Abdominal: Soft. There is abdominal tenderness (Mild suprapubic tenderness to palpation with no rebound or guarding). There is no rebound and no guarding.  Musculoskeletal:        General: No tenderness or edema.      Cervical back: Normal range of motion and neck supple.     Comments: No reproducible CVA tenderness on exam  Lymphadenopathy:    He has no cervical adenopathy.  Neurological: He is alert and oriented to person, place, and time.  Skin: Skin is warm and dry.  Psychiatric: He has a normal mood and affect. His behavior is normal.  Nursing note and vitals reviewed.   BP (!) 149/87   Pulse (!) 54   Resp 16   Ht 5\' 5"  (1.651 m)   Wt 133 lb 9.6 oz (60.6 kg)   SpO2 100%   BMI 22.23 kg/m  Wt Readings from Last 3 Encounters:  05/09/19 133 lb 9.6 oz (60.6 kg)  04/21/18 126 lb (57.2 kg)  12/27/17 124 lb (56.2 kg)     Health Maintenance Due  Topic Date Due  . Hepatitis C Screening  03[birth year]01/[birth year]  . HIV Screening  04/13/1973  . TETANUS/TDAP  04/13/1977  . INFLUENZA VACCINE  12/02/2018   Health maintenance discussed with the patient and he agrees to have screening for hepatitis C. he agrees to have influenza immunization.  He declines tetanus immunization.  Screening for HIV declined.  Lab Results  Component Value Date   TSH 7.580 (H) 08/26/2017   Lab Results  Component Value Date   WBC 3.8 02/08/2017   HGB 13.7 02/08/2017   HCT 40.6 02/08/2017   MCV 89 02/08/2017   PLT 210 02/08/2017   Lab Results  Component Value Date   NA 142 02/08/2017   K 3.7 02/08/2017   CO2 25 02/08/2017   GLUCOSE 93 02/08/2017   BUN 26 (H) 02/08/2017   CREATININE 0.95 02/08/2017   BILITOT 1.5 (H) 02/08/2017   ALKPHOS 51 02/08/2017   AST 18 02/08/2017   ALT 9 02/08/2017   PROT 7.9 02/08/2017   ALBUMIN 5.0 02/08/2017   CALCIUM 10.5 (H) 02/08/2017   ANIONGAP 4 (L) 04/14/2015   Lab Results  Component Value Date   CHOL 204 (H) 12/27/2017   Lab Results  Component Value Date   HDL 72 12/27/2017   Lab Results  Component Value Date   LDLCALC 121 (H) 12/27/2017   Lab Results  Component Value Date   TRIG 55 12/27/2017   Lab Results  Component Value Date   CHOLHDL 2.8 12/27/2017   Lab  Results  Component Value Date   HGBA1C 5.2 02/08/2017      Assessment & Plan:  1. Lower abdominal pain Patient with mild suprapubic discomfort on examination as well as complaints of urinary frequency.  Patient will have urinalysis to look for possible urinary tract infection as the cause of his discomfort. - POCT URINALYSIS DIP (CLINITEK)  2. Urinary frequency Patient with complaint of recent issues with urinary frequency and additionally has complaint of lower abdominal discomfort as well as mild suprapubic discomfort on exam.  He will have urinalysis to look for possible urinary tract infection as a cause of his symptoms but will also have glucose and hemoglobin A1c to look for possible hypoglycemia or diabetes as a cause of his urinary frequency. - HgB A1c - Glucose (CBG) - POCT URINALYSIS DIP (CLINITEK)  3. Elevated blood pressure reading Patient with elevated blood pressure at today's visit.  He is encouraged to follow a low-sodium diet and return for nurse visit for blood pressure recheck.  4. Need for hepatitis C screening test 5. Need for hepatitis B screening test Discussed with the patient that screening for hepatitis C was recommended due to his age group.  Patient also request screening for hepatitis B as he believes that he was told in the past that he may have had some form of hepatitis but he cannot remember what kind.  Patient will have hepatitis panel and will be notified of the results and if any further interventions or follow-up are needed based on the results. - HP5  6. Abnormal serum thyroid stimulating hormone (TSH) level On review of chart he has had past abnormal TSH of 7.58 and he will have repeat TSH to see if he may need treatment for hypothyroidism. - TSH  7.  Need for  influenza immunization He was offered and agreed to have influenza immunization at today's visit which will be done as nurse visit or by clinical pharmacist while patient is at today's visit.   He received educational information regarding influenza immunization as part of his AVS-after visit summary.   An After Visit Summary was printed and given to the patient.   Follow-up: Return for 4-6 weeks if continued abdominal pain.    Cain Saupe, MD

## 2019-05-10 ENCOUNTER — Other Ambulatory Visit: Payer: Self-pay | Admitting: Family Medicine

## 2019-05-10 DIAGNOSIS — R7689 Other specified abnormal immunological findings in serum: Secondary | ICD-10-CM

## 2019-05-10 DIAGNOSIS — R768 Other specified abnormal immunological findings in serum: Secondary | ICD-10-CM

## 2019-05-10 LAB — HP5
Hep B Core Total Ab: NEGATIVE
Hep B Surface Ab, Qual: REACTIVE
Hep C Virus Ab: 0.1 {s_co_ratio} (ref 0.0–0.9)
Hepatitis B Surface Ag: NEGATIVE
hep A Total Ab: POSITIVE — AB

## 2019-05-14 ENCOUNTER — Telehealth: Payer: Self-pay | Admitting: *Deleted

## 2019-05-14 NOTE — Telephone Encounter (Signed)
Medical Assistant used [employer] to contact patient.  Interpreter Name: Ahmed Interpreter #:  [phone] Patients friend NICK verified DOB and will bring patient tomorrow for lab work up.

## 2019-05-14 NOTE — Telephone Encounter (Signed)
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 Please ask patient to come in next week for additional blood work to check liver enzymes and antibody testing. Also ask if he has had recent abdominal pain, fever, nausea or has otherwise not felt well.

## 2019-05-15 ENCOUNTER — Ambulatory Visit: Payer: Self-pay | Attending: Family Medicine

## 2019-05-15 ENCOUNTER — Other Ambulatory Visit: Payer: Self-pay

## 2019-05-15 DIAGNOSIS — R768 Other specified abnormal immunological findings in serum: Secondary | ICD-10-CM

## 2019-05-16 LAB — HEPATIC FUNCTION PANEL
ALT: 14 IU/L (ref 0–44)
AST: 28 IU/L (ref 0–40)
Albumin: 4.3 g/dL (ref 3.8–4.8)
Alkaline Phosphatase: 67 IU/L (ref 39–117)
Bilirubin Total: 0.6 mg/dL (ref 0.0–1.2)
Bilirubin, Direct: 0.17 mg/dL (ref 0.00–0.40)
Total Protein: 7.5 g/dL (ref 6.0–8.5)

## 2019-05-16 LAB — HEPATITIS A ANTIBODY, IGM
Hep A IgM: NEGATIVE
Hep A IgM: NEGATIVE

## 2019-05-16 LAB — SPECIMEN STATUS REPORT

## 2019-05-21 ENCOUNTER — Telehealth: Payer: Self-pay | Admitting: *Deleted

## 2019-05-21 NOTE — Telephone Encounter (Signed)
-----   Message from Cain Saupe, MD sent at 05/16/2019 12:46 PM EST ----- Negative hepatitis A IGM antibodies so likely not an acute infection.

## 2019-05-21 NOTE — Telephone Encounter (Signed)
Medical Assistant used [employer] to contact patient.  Interpreter Name: Talbert Nan #: [phone] Patient is aware of HEP A IG being negative so likely not an acute infection.

## 2019-07-25 ENCOUNTER — Other Ambulatory Visit: Payer: Self-pay

## 2019-07-25 ENCOUNTER — Ambulatory Visit (INDEPENDENT_AMBULATORY_CARE_PROVIDER_SITE_OTHER): Payer: Self-pay | Admitting: Internal Medicine

## 2019-07-25 ENCOUNTER — Encounter: Payer: Self-pay | Admitting: Internal Medicine

## 2019-07-25 VITALS — BP 135/70 | HR 45 | Temp 97.3°F | Ht 66.0 in | Wt 135.4 lb

## 2019-07-25 DIAGNOSIS — R001 Bradycardia, unspecified: Secondary | ICD-10-CM

## 2019-07-25 DIAGNOSIS — Z131 Encounter for screening for diabetes mellitus: Secondary | ICD-10-CM

## 2019-07-25 DIAGNOSIS — Z13220 Encounter for screening for lipoid disorders: Secondary | ICD-10-CM

## 2019-07-25 NOTE — Progress Notes (Signed)
OFFICE NOTE  Chief Complaint:  Follow-up  Primary Care Physician: Antony Blackbird, MD  HPI:  Matthew Ponce is a 64 y.o. male with a past medial history significant for atypical chest pain which is described as a burning sensation, worse after eating or laying down.  Not necessarily worse with exertion or relieved by rest.  He underwent treadmill exercise stress testing which demonstrated 1 mm inferior and lateral ST depression at peak exercise at improved at rest.  He denied any anginal symptoms with exercise.  He was able to exercise for 7 minutes, with max predicted heart rate 81% and 8.5 metabolic equivalent workload.  He reports no recurrent significant discomfort in the chest.  These episodes lasted for only a few seconds at a time and sound very atypical for angina.  On top of this, he is homeless.  He is living in a mosque/shelter.  He is fasting for Ramadan.  He had numerous questions about dietary recommendations which we provided today.  Overall though, despite his stress test being mildly abnormal, I feel that his symptoms are not likely anginal.  I would recommend medical therapy.  04/21/2018  [name] is seen today in follow-up.  He is accompanied by a friend.  He saw Almyra Deforest, PA-C in August.  The time blood work indicated elevated cholesterol.  It was recommended he start high potency atorvastatin 40 mg daily.  He says he has not been compliant with that medication.  He is also not compliant with aspirin.  He continues to speak about how he can make dietary changes possibly to help his symptoms.  He is complaining of more pain in his back rather than chest pain at this point.  He continues in a homeless status living and a mosque.  He has no insurance and is in the nonresident.  [facility]  07/25/2019  [name] returns for follow-up.  He is not accompanied by his friend today.  He reports still being homeless and living at a local  mosque.  He denies any chest pain or worsening shortness of breath but occasionally still gets pain behind his shoulder blades.  Blood pressure is improved today however heart rate is noted to be low in the 40s.  He cannot explain whether his heart rate increased with exertion.  He does report some fatigue and says that he was considering trying to get disability.  From a cardiac standpoint is not clear what would be disabling for him.  He is noncompliant with any medications.  Previously I recommended a statin however he is not taking it and has not had recent checks of it.  PMHx:  Past Medical History:  Diagnosis Date  . Anxiety   . Asthma   . Depression   . HTN (hypertension)   . Sleep apnea     Past Surgical History:  Procedure Laterality Date  . REFRACTIVE SURGERY Bilateral     FAMHx:  Family History  Problem Relation Age of Onset  . Hypertension Maternal Grandmother   . Diabetes Mellitus II Maternal Grandmother   . Hypertension Paternal Grandmother   . Diabetes Mellitus II Paternal Grandmother     SOCHx:   reports that he has never smoked. He has never used smokeless tobacco. He reports that he does not drink alcohol or use drugs.  ALLERGIES:  No Known Allergies  ROS: Pertinent items noted in HPI and remainder of comprehensive ROS otherwise negative.  HOME MEDS: Current  Outpatient Medications on File Prior to Visit  Medication Sig Dispense Refill  . aspirin EC 81 MG tablet Take 81 mg by mouth daily.    Marland Kitchen atorvastatin (LIPITOR) 40 MG tablet Take 1 tablet (40 mg total) by mouth daily. 90 tablet 3  . nitroGLYCERIN (NITROSTAT) 0.4 MG SL tablet Place 1 tablet (0.4 mg total) under the tongue every 5 (five) minutes as needed for chest pain. 90 tablet 3   Current Facility-Administered Medications on File Prior to Visit  Medication Dose Route Frequency Provider Last Rate Last Admin  . 0.9 %  sodium chloride infusion  500 mL Intravenous Once Hilarie Fredrickson, MD         LABS/IMAGING: No results found for this or any previous visit (from the past 48 hour(s)). No results found.  LIPID PANEL:    Component Value Date/Time   CHOL 204 (H) 12/27/2017 0851   TRIG 55 12/27/2017 0851   HDL 72 12/27/2017 0851   CHOLHDL 2.8 12/27/2017 0851   LDLCALC 121 (H) 12/27/2017 0851     WEIGHTS: Wt Readings from Last 3 Encounters:  07/25/19 135 lb 6.4 oz (61.4 kg)  05/09/19 133 lb 9.6 oz (60.6 kg)  04/21/18 126 lb (57.2 kg)    VITALS: BP 135/70   Pulse (!) 45   Temp (!) 97.3 F (36.3 C)   Ht 5\' 6"  (1.676 m)   Wt 135 lb 6.4 oz (61.4 kg)   SpO2 95%   BMI 21.85 kg/m   EXAM: General appearance: alert and no distress Lungs: clear to auscultation bilaterally Heart: regular rate and rhythm, S1, S2 normal, no murmur, click, rub or gallop Neurologic: Grossly normal  EKG: Sinus bradycardia at 45-personally reviewed  ASSESSMENT: 1. Atypical chest pain 2. Mildly abnormal exercise treadmill stress test 3. Bradycardia 4. Treatment noncompliance  PLAN: 1.   Mr. Melander continues to be noncompliant with medications and seems averse to them.  He does desire lab work today including an A1c and will test a lipid profile and metabolic profile.  If this is significantly abnormal, we will have to have a discussion about treatment options.  He is noted to be bradycardic without medications.  I advised him to monitor for his ability increased heart rate with exertion.  Is not clear if this is contributing to his fatigue however could be related.  He intends to try to get disability and I will defer this to his primary care provider.Eula Listen, MD, [facility], FACP    [facility]  Medical Director of the [facility]  [facility] Diplomate of the American Board of Clinical Lipidology Attending Cardiologist  Direct Dial: [phone]  Fax: [phone]  Website:  [phone]  Kahmari Koller 07/25/2019, 3:29 PM

## 2019-07-25 NOTE — Patient Instructions (Signed)
Medication Instructions:  NO CHANGES *If you need a refill on your cardiac medications before your next appointment, please call your pharmacy*   Lab Work: Lipid, A1c, CMET today   If you have labs (blood work) drawn today and your tests are completely normal, you will receive your results only by: Marland Kitchen MyChart Message (if you have MyChart) OR . A paper copy in the mail If you have any lab test that is abnormal or we need to change your treatment, we will call you to review the results.   Testing/Procedures: NONE   Follow-Up: At [facility], you and your health needs are our priority.  As part of our continuing mission to provide you with exceptional heart care, we have created designated Provider Care Teams.  These Care Teams include your primary Cardiologist (physician) and Advanced Practice Providers (APPs -  Physician Assistants and Nurse Practitioners) who all work together to provide you with the care you need, when you need it.  We recommend signing up for the patient portal called "MyChart".  Sign up information is provided on this After Visit Summary.  MyChart is used to connect with patients for Virtual Visits (Telemedicine).  Patients are able to view lab/test results, encounter notes, upcoming appointments, etc.  Non-urgent messages can be sent to your provider as well.   To learn more about what you can do with MyChart, go to [email or web address].    Your next appointment:   12 month(s)  The format for your next appointment:   In Person  Provider:   You may see Dr. Rennis Golden or one of the following Advanced Practice Providers on your designated Care Team:    Azalee Course, PA-C  Micah Flesher, [place] or   Judy Pimple, [place]    Other Instructions

## 2019-07-26 ENCOUNTER — Other Ambulatory Visit: Payer: Self-pay | Admitting: Internal Medicine

## 2019-07-26 LAB — LIPID PANEL
Chol/HDL Ratio: 2.5 ratio (ref 0.0–5.0)
Cholesterol, Total: 230 mg/dL — ABNORMAL HIGH (ref 100–199)
HDL: 93 mg/dL (ref >39)
LDL Chol Calc (NIH): 131 mg/dL — ABNORMAL HIGH (ref 0–99)
Triglycerides: 36 mg/dL (ref 0–149)
VLDL Cholesterol Cal: 6 mg/dL (ref 5–40)

## 2019-07-26 LAB — COMPREHENSIVE METABOLIC PANEL
ALT: 12 IU/L (ref 0–44)
AST: 28 IU/L (ref 0–40)
Albumin/Globulin Ratio: 1.6 (ref 1.2–2.2)
Albumin: 4.8 g/dL (ref 3.8–4.8)
Alkaline Phosphatase: 47 IU/L (ref 39–117)
BUN/Creatinine Ratio: 29 — ABNORMAL HIGH (ref 10–24)
BUN: 26 mg/dL (ref 8–27)
Bilirubin Total: 0.9 mg/dL (ref 0.0–1.2)
CO2: 23 mmol/L (ref 20–29)
Calcium: 9.9 mg/dL (ref 8.6–10.2)
Chloride: 105 mmol/L (ref 96–106)
Creatinine, Ser: 0.89 mg/dL (ref 0.76–1.27)
GFR calc Af Amer: 107 mL/min/{1.73_m2} (ref >59)
GFR calc non Af Amer: 92 mL/min/{1.73_m2} (ref >59)
Globulin, Total: 3 g/dL (ref 1.5–4.5)
Glucose: 83 mg/dL (ref 65–99)
Potassium: 5 mmol/L (ref 3.5–5.2)
Sodium: 143 mmol/L (ref 134–144)
Total Protein: 7.8 g/dL (ref 6.0–8.5)

## 2019-07-26 LAB — HEMOGLOBIN A1C
Est. average glucose Bld gHb Est-mCnc: 114 mg/dL
Hgb A1c MFr Bld: 5.6 % (ref 4.8–5.6)

## 2019-07-26 MED ORDER — ATORVASTATIN CALCIUM 40 MG PO TABS: 40.0000 mg | ORAL_TABLET | Freq: Every day | ORAL | 3 refills | Status: AC

## 2019-07-26 MED FILL — ATORVASTATIN CALCIUM 40 MG: 40 | 30 days supply | Qty: 30 | Fill #0

## 2019-07-26 NOTE — Telephone Encounter (Signed)
Spoke with friend Weston Brass [facility]) about results. Rx(s) sent to pharmacy electronically.

## 2019-08-25 ENCOUNTER — Ambulatory Visit: Payer: Self-pay | Attending: Internal Medicine

## 2019-08-25 DIAGNOSIS — Z23 Encounter for immunization: Secondary | ICD-10-CM

## 2019-08-25 NOTE — Progress Notes (Signed)
   Covid-19 Vaccination Clinic  Name:  Matthew Ponce    MRN: [id number] DOB: 06-11-1955  08/25/2019  [name] was observed post Covid-19 immunization for 15 minutes without incident. He was provided with Vaccine Information Sheet and instruction to access the V-Safe system.   [name] was instructed to call 911 with any severe reactions post vaccine: Marland Kitchen Difficulty breathing  . Swelling of face and throat  . A fast heartbeat  . A bad rash all over body  . Dizziness and weakness   Immunizations Administered    Name Date Dose VIS Date Route   Pfizer COVID-19 Vaccine 08/25/2019 12:40 PM 0.3 mL 06/27/2018 Intramuscular   Manufacturer: [employer], [employer]   Lot: [id number]   NDC: [address]-1

## 2019-09-22 ENCOUNTER — Ambulatory Visit: Payer: Self-pay | Attending: Internal Medicine

## 2019-09-22 DIAGNOSIS — Z23 Encounter for immunization: Secondary | ICD-10-CM

## 2019-09-22 NOTE — Progress Notes (Signed)
   Covid-19 Vaccination Clinic  Name:  Esequiel Kleinfelter    MRN: [id number] DOB: November 03, 1955  09/22/2019  Mr. Balaban was observed post Covid-19 immunization for 15 minutes without incident. He was provided with Vaccine Information Sheet and instruction to access the V-Safe system.   Mr. Likes was instructed to call 911 with any severe reactions post vaccine: Marland Kitchen Difficulty breathing  . Swelling of face and throat  . A fast heartbeat  . A bad rash all over body  . Dizziness and weakness   Immunizations Administered    Name Date Dose VIS Date Route   Pfizer COVID-19 Vaccine 09/22/2019  1:59 PM 0.3 mL 06/27/2018 Intramuscular   Manufacturer: [employer], [employer]   Lot: [id number]   NDC: [address]-6

## 2019-09-26 IMAGING — CR DG ABDOMEN 2V
2 series · 2 of 2 positions shown · non-contrast
Comparison: No priors.

CLINICAL DATA: 60-year-old male with chronic generalize abdominal
pain with pencil like stools for the past several months. No
associated nausea or vomiting.

EXAM:
ABDOMEN - 2 VIEW

[abdomen erect]
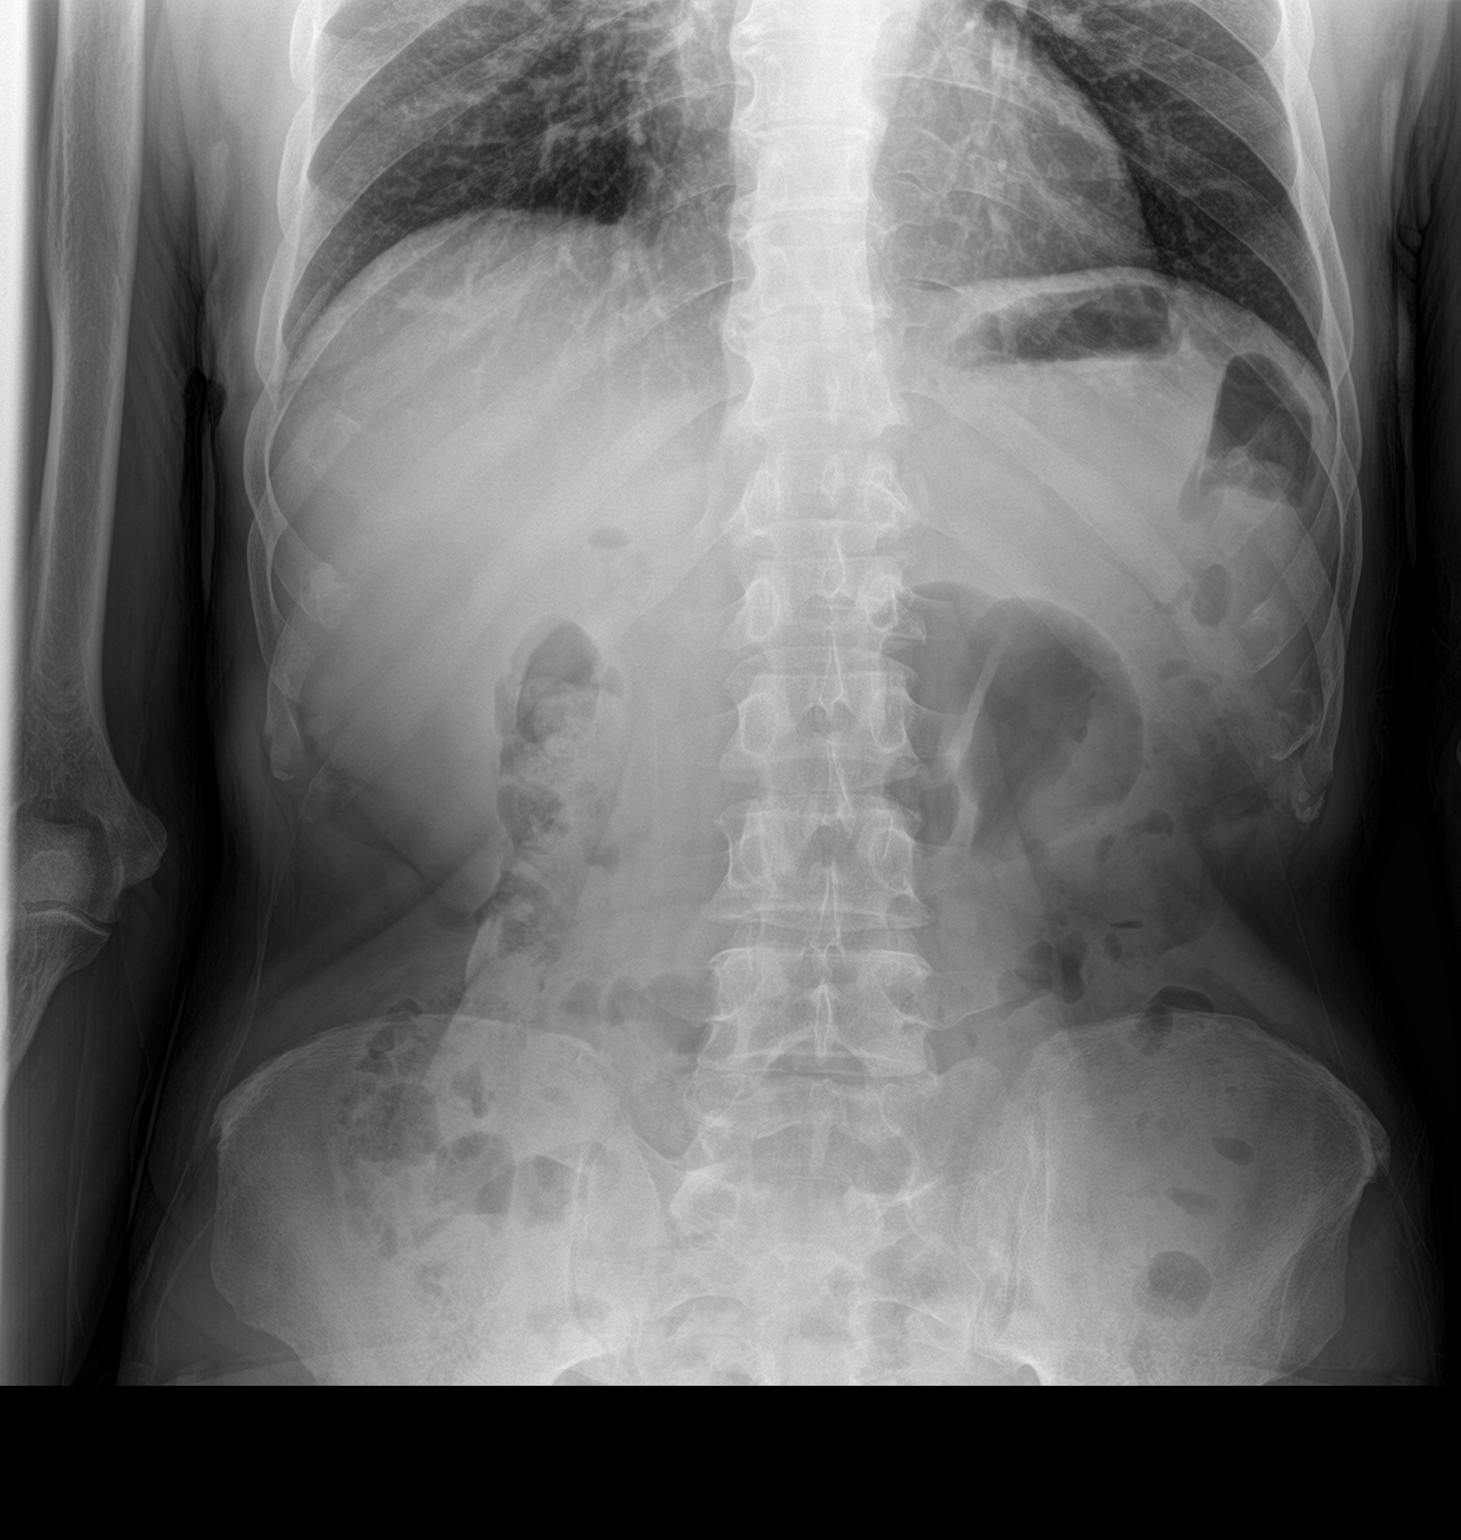

[abdomen supine]
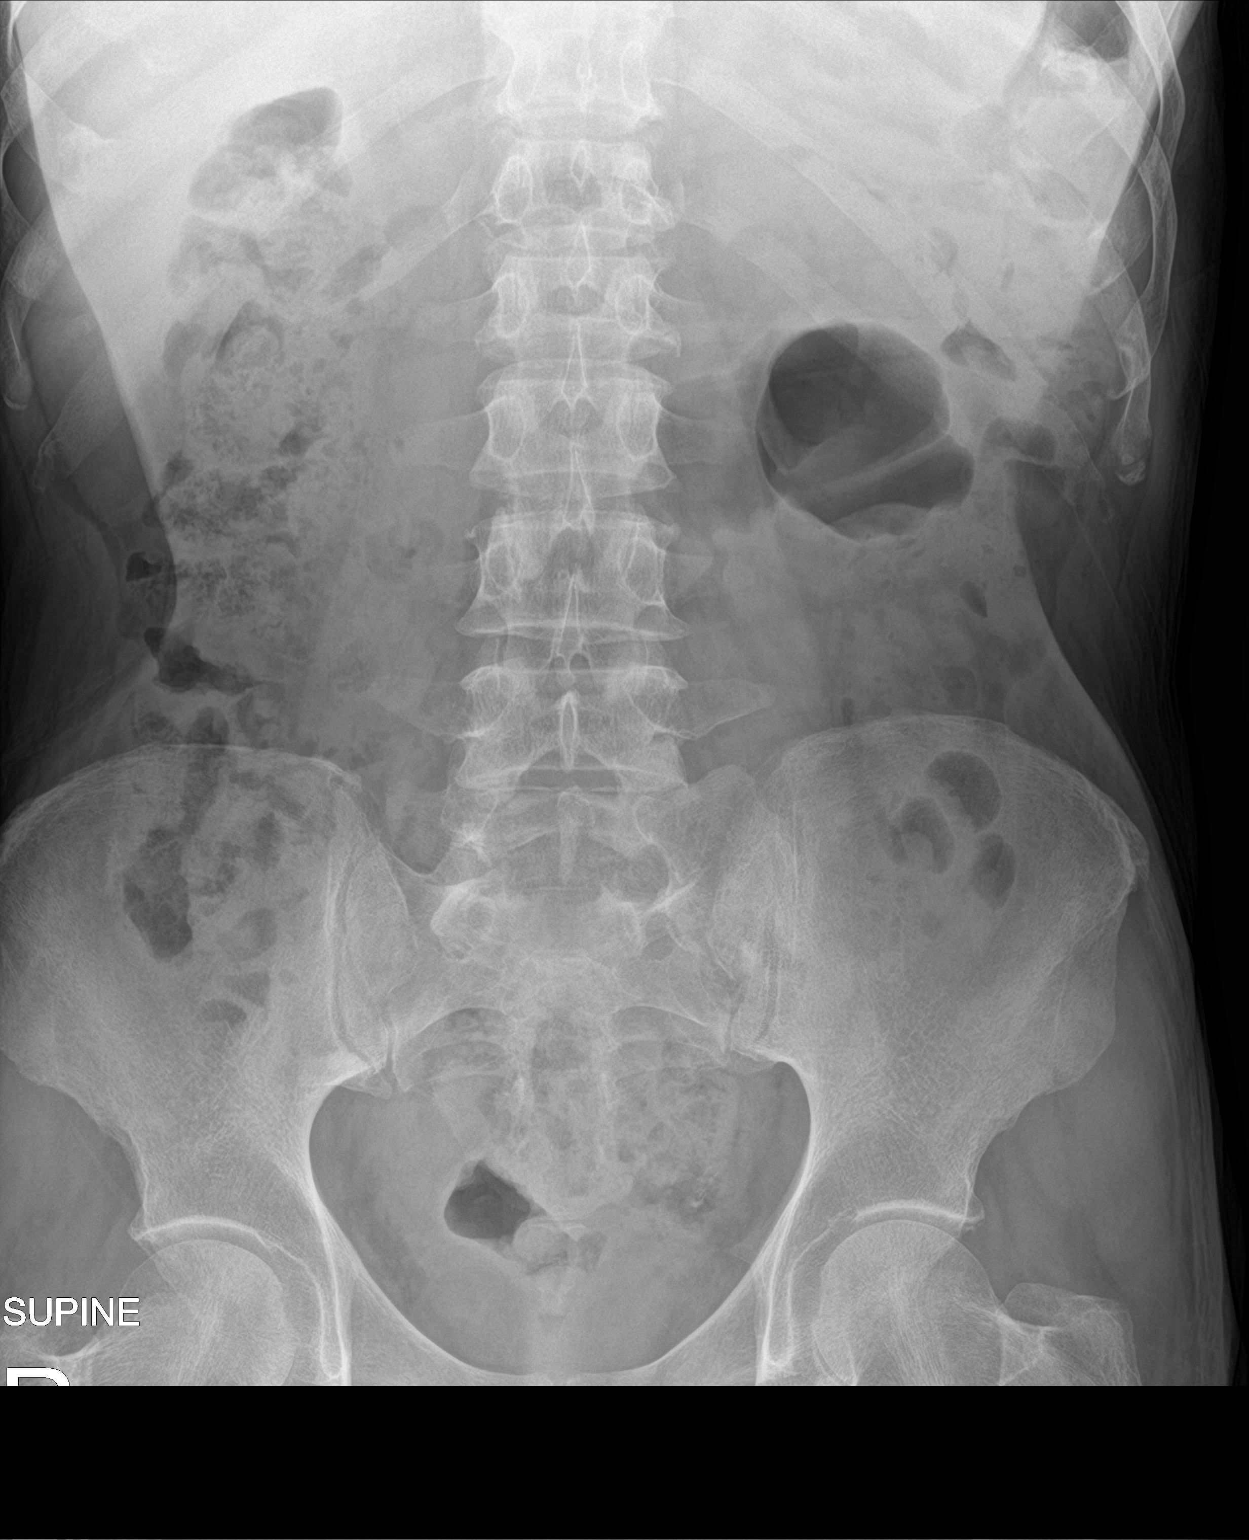

[2 of 2 positions shown; findings below may reference images not displayed]

FINDINGS: Gas and stool are seen scattered throughout the colon extending to
the level of the distal rectum. No pathologic distension of small
bowel is noted. No gross evidence of pneumoperitoneum.
IMPRESSION: 1.  Nonobstructive bowel gas pattern.
2. No pneumoperitoneum.

## 2019-10-30 ENCOUNTER — Ambulatory Visit: Payer: Self-pay | Admitting: Internal Medicine

## 2019-11-15 ENCOUNTER — Telehealth: Payer: Self-pay | Admitting: Family Medicine

## 2019-11-15 NOTE — Telephone Encounter (Signed)
Was informed medical records were ready for pick up as requested. Verbalized understanding.

## 2020-04-14 ENCOUNTER — Ambulatory Visit: Payer: Self-pay
